# Patient Record
Sex: Female | Born: 1935 | Race: Black or African American | Hispanic: No | Smoking: Never smoker
Health system: Southern US, Community
[De-identification: ages and names within clinical notes are randomized; demographics above are authoritative.]

## PROBLEM LIST (undated history)

## (undated) DIAGNOSIS — I10 Essential (primary) hypertension: Secondary | ICD-10-CM

## (undated) DIAGNOSIS — E119 Type 2 diabetes mellitus without complications: Secondary | ICD-10-CM

## (undated) DIAGNOSIS — D649 Anemia, unspecified: Secondary | ICD-10-CM

## (undated) DIAGNOSIS — I639 Cerebral infarction, unspecified: Secondary | ICD-10-CM

---

## 2013-02-11 DIAGNOSIS — I639 Cerebral infarction, unspecified: Secondary | ICD-10-CM

## 2013-02-11 HISTORY — DX: Cerebral infarction, unspecified: I63.9

## 2013-09-25 ENCOUNTER — Emergency Department (HOSPITAL_COMMUNITY): Payer: Medicare Other

## 2013-09-25 ENCOUNTER — Encounter (HOSPITAL_COMMUNITY): Payer: Medicare Other | Admitting: Certified Registered Nurse Anesthetist

## 2013-09-25 ENCOUNTER — Inpatient Hospital Stay (HOSPITAL_COMMUNITY): Payer: Medicare Other | Admitting: Certified Registered Nurse Anesthetist

## 2013-09-25 ENCOUNTER — Encounter (HOSPITAL_COMMUNITY): Payer: Self-pay | Admitting: Emergency Medicine

## 2013-09-25 ENCOUNTER — Encounter (HOSPITAL_COMMUNITY)
Admission: EM | Disposition: A | Discharge status: Skilled Nursing Facility | Payer: Self-pay | Source: Home / Self Care | Attending: Neurology

## 2013-09-25 ENCOUNTER — Inpatient Hospital Stay (HOSPITAL_COMMUNITY): Payer: Medicare Other

## 2013-09-25 ENCOUNTER — Inpatient Hospital Stay (HOSPITAL_COMMUNITY)
Admission: EM | Admit: 2013-09-25 | Discharge: 2013-10-11 | DRG: 064 | Disposition: A | Discharge status: Skilled Nursing Facility | Payer: Medicare Other | Attending: Neurology | Admitting: Neurology

## 2013-09-25 DIAGNOSIS — G9340 Encephalopathy, unspecified: Secondary | ICD-10-CM | POA: Diagnosis present

## 2013-09-25 DIAGNOSIS — I2510 Atherosclerotic heart disease of native coronary artery without angina pectoris: Secondary | ICD-10-CM | POA: Diagnosis present

## 2013-09-25 DIAGNOSIS — K921 Melena: Secondary | ICD-10-CM | POA: Diagnosis not present

## 2013-09-25 DIAGNOSIS — N179 Acute kidney failure, unspecified: Secondary | ICD-10-CM | POA: Diagnosis present

## 2013-09-25 DIAGNOSIS — I6340 Cerebral infarction due to embolism of unspecified cerebral artery: Secondary | ICD-10-CM | POA: Diagnosis present

## 2013-09-25 DIAGNOSIS — R4701 Aphasia: Secondary | ICD-10-CM | POA: Diagnosis present

## 2013-09-25 DIAGNOSIS — G8190 Hemiplegia, unspecified affecting unspecified side: Secondary | ICD-10-CM | POA: Diagnosis present

## 2013-09-25 DIAGNOSIS — Z66 Do not resuscitate: Secondary | ICD-10-CM | POA: Diagnosis not present

## 2013-09-25 DIAGNOSIS — S301XXA Contusion of abdominal wall, initial encounter: Secondary | ICD-10-CM | POA: Diagnosis present

## 2013-09-25 DIAGNOSIS — L97909 Non-pressure chronic ulcer of unspecified part of unspecified lower leg with unspecified severity: Secondary | ICD-10-CM | POA: Diagnosis present

## 2013-09-25 DIAGNOSIS — I6350 Cerebral infarction due to unspecified occlusion or stenosis of unspecified cerebral artery: Secondary | ICD-10-CM | POA: Diagnosis present

## 2013-09-25 DIAGNOSIS — I63412 Cerebral infarction due to embolism of left middle cerebral artery: Secondary | ICD-10-CM

## 2013-09-25 DIAGNOSIS — R51 Headache: Secondary | ICD-10-CM | POA: Diagnosis not present

## 2013-09-25 DIAGNOSIS — D62 Acute posthemorrhagic anemia: Secondary | ICD-10-CM | POA: Diagnosis present

## 2013-09-25 DIAGNOSIS — D65 Disseminated intravascular coagulation [defibrination syndrome]: Secondary | ICD-10-CM | POA: Diagnosis present

## 2013-09-25 DIAGNOSIS — E039 Hypothyroidism, unspecified: Secondary | ICD-10-CM | POA: Diagnosis present

## 2013-09-25 DIAGNOSIS — F0390 Unspecified dementia without behavioral disturbance: Secondary | ICD-10-CM | POA: Diagnosis present

## 2013-09-25 DIAGNOSIS — I63032 Cerebral infarction due to thrombosis of left carotid artery: Secondary | ICD-10-CM

## 2013-09-25 DIAGNOSIS — L97921 Non-pressure chronic ulcer of unspecified part of left lower leg limited to breakdown of skin: Secondary | ICD-10-CM

## 2013-09-25 DIAGNOSIS — E872 Acidosis, unspecified: Secondary | ICD-10-CM | POA: Diagnosis present

## 2013-09-25 DIAGNOSIS — H409 Unspecified glaucoma: Secondary | ICD-10-CM | POA: Diagnosis present

## 2013-09-25 DIAGNOSIS — R578 Other shock: Secondary | ICD-10-CM | POA: Diagnosis present

## 2013-09-25 DIAGNOSIS — S88119A Complete traumatic amputation at level between knee and ankle, unspecified lower leg, initial encounter: Secondary | ICD-10-CM | POA: Diagnosis not present

## 2013-09-25 DIAGNOSIS — Z515 Encounter for palliative care: Secondary | ICD-10-CM

## 2013-09-25 DIAGNOSIS — R5381 Other malaise: Secondary | ICD-10-CM | POA: Diagnosis present

## 2013-09-25 DIAGNOSIS — I498 Other specified cardiac arrhythmias: Secondary | ICD-10-CM | POA: Diagnosis present

## 2013-09-25 DIAGNOSIS — R4182 Altered mental status, unspecified: Secondary | ICD-10-CM | POA: Diagnosis present

## 2013-09-25 DIAGNOSIS — I469 Cardiac arrest, cause unspecified: Secondary | ICD-10-CM | POA: Diagnosis present

## 2013-09-25 DIAGNOSIS — Y9210 Unspecified residential institution as the place of occurrence of the external cause: Secondary | ICD-10-CM | POA: Diagnosis not present

## 2013-09-25 DIAGNOSIS — Y849 Medical procedure, unspecified as the cause of abnormal reaction of the patient, or of later complication, without mention of misadventure at the time of the procedure: Secondary | ICD-10-CM | POA: Diagnosis not present

## 2013-09-25 DIAGNOSIS — I10 Essential (primary) hypertension: Secondary | ICD-10-CM | POA: Diagnosis present

## 2013-09-25 DIAGNOSIS — R1310 Dysphagia, unspecified: Secondary | ICD-10-CM | POA: Diagnosis present

## 2013-09-25 DIAGNOSIS — I739 Peripheral vascular disease, unspecified: Secondary | ICD-10-CM | POA: Diagnosis present

## 2013-09-25 DIAGNOSIS — E119 Type 2 diabetes mellitus without complications: Secondary | ICD-10-CM | POA: Diagnosis present

## 2013-09-25 DIAGNOSIS — J9600 Acute respiratory failure, unspecified whether with hypoxia or hypercapnia: Secondary | ICD-10-CM | POA: Diagnosis present

## 2013-09-25 DIAGNOSIS — I160 Hypertensive urgency: Secondary | ICD-10-CM

## 2013-09-25 DIAGNOSIS — I69391 Dysphagia following cerebral infarction: Secondary | ICD-10-CM

## 2013-09-25 DIAGNOSIS — E876 Hypokalemia: Secondary | ICD-10-CM | POA: Diagnosis present

## 2013-09-25 DIAGNOSIS — I440 Atrioventricular block, first degree: Secondary | ICD-10-CM | POA: Diagnosis present

## 2013-09-25 DIAGNOSIS — I639 Cerebral infarction, unspecified: Secondary | ICD-10-CM

## 2013-09-25 DIAGNOSIS — R58 Hemorrhage, not elsewhere classified: Secondary | ICD-10-CM | POA: Diagnosis present

## 2013-09-25 DIAGNOSIS — E785 Hyperlipidemia, unspecified: Secondary | ICD-10-CM | POA: Diagnosis present

## 2013-09-25 DIAGNOSIS — Z7982 Long term (current) use of aspirin: Secondary | ICD-10-CM

## 2013-09-25 HISTORY — PX: RADIOLOGY WITH ANESTHESIA: SHX6223

## 2013-09-25 HISTORY — DX: Cerebral infarction, unspecified: I63.9

## 2013-09-25 HISTORY — DX: Essential (primary) hypertension: I10

## 2013-09-25 HISTORY — DX: Type 2 diabetes mellitus without complications: E11.9

## 2013-09-25 HISTORY — DX: Anemia, unspecified: D64.9

## 2013-09-25 LAB — CBC
HEMATOCRIT: 31.1 % — AB (ref 36.0–46.0)
HEMOGLOBIN: 9.9 g/dL — AB (ref 12.0–15.0)
MCH: 31.7 pg (ref 26.0–34.0)
MCHC: 31.8 g/dL (ref 30.0–36.0)
MCV: 99.7 fL (ref 78.0–100.0)
Platelets: 332 10*3/uL (ref 150–400)
RBC: 3.12 MIL/uL — ABNORMAL LOW (ref 3.87–5.11)
RDW: 13.7 % (ref 11.5–15.5)
WBC: 6.9 10*3/uL (ref 4.0–10.5)

## 2013-09-25 LAB — DIFFERENTIAL
Basophils Absolute: 0 10*3/uL (ref 0.0–0.1)
Basophils Relative: 1 % (ref 0–1)
Eosinophils Absolute: 0.1 10*3/uL (ref 0.0–0.7)
Eosinophils Relative: 1 % (ref 0–5)
Lymphocytes Relative: 25 % (ref 12–46)
Lymphs Abs: 1.7 10*3/uL (ref 0.7–4.0)
MONOS PCT: 5 % (ref 3–12)
Monocytes Absolute: 0.4 10*3/uL (ref 0.1–1.0)
NEUTROS ABS: 4.7 10*3/uL (ref 1.7–7.7)
Neutrophils Relative %: 68 % (ref 43–77)

## 2013-09-25 LAB — I-STAT CHEM 8, ED
BUN: 23 mg/dL (ref 6–23)
CALCIUM ION: 1.09 mmol/L — AB (ref 1.13–1.30)
CREATININE: 1.5 mg/dL — AB (ref 0.50–1.10)
Chloride: 106 mEq/L (ref 96–112)
Glucose, Bld: 157 mg/dL — ABNORMAL HIGH (ref 70–99)
HCT: 33 % — ABNORMAL LOW (ref 36.0–46.0)
Hemoglobin: 11.2 g/dL — ABNORMAL LOW (ref 12.0–15.0)
POTASSIUM: 3.3 meq/L — AB (ref 3.7–5.3)
Sodium: 137 mEq/L (ref 137–147)
TCO2: 21 mmol/L (ref 0–100)

## 2013-09-25 LAB — I-STAT TROPONIN, ED: TROPONIN I, POC: 0.27 ng/mL — AB (ref 0.00–0.08)

## 2013-09-25 LAB — CBG MONITORING, ED: Glucose-Capillary: 145 mg/dL — ABNORMAL HIGH (ref 70–99)

## 2013-09-25 LAB — PROTIME-INR
INR: 1.04 (ref 0.00–1.49)
Prothrombin Time: 13.6 seconds (ref 11.6–15.2)

## 2013-09-25 LAB — APTT: APTT: 24 s (ref 24–37)

## 2013-09-25 SURGERY — RADIOLOGY WITH ANESTHESIA
Anesthesia: General

## 2013-09-25 MED ORDER — ROCURONIUM BROMIDE 50 MG/5ML IV SOLN
INTRAVENOUS | Status: AC
  Filled 2013-09-25: qty 1

## 2013-09-25 MED ORDER — SUCCINYLCHOLINE CHLORIDE 20 MG/ML IJ SOLN
INTRAMUSCULAR | Status: DC | PRN
Start: 2013-09-25 — End: 2013-09-26
  Administered 2013-09-25: 60 mg via INTRAVENOUS

## 2013-09-25 MED ORDER — PROPOFOL 10 MG/ML IV BOLUS
INTRAVENOUS | Status: DC | PRN
  Administered 2013-09-25: 70 mg via INTRAVENOUS
  Administered 2013-09-26 (×2): 50 mg via INTRAVENOUS

## 2013-09-25 MED ORDER — HYDRALAZINE HCL 20 MG/ML IJ SOLN
10.0000 mg | Freq: Once | INTRAMUSCULAR | Status: AC
  Administered 2013-09-25: 10 mg via INTRAVENOUS

## 2013-09-25 MED ORDER — LIDOCAINE HCL 1 % IJ SOLN
INTRAMUSCULAR | Status: AC
Start: 2013-09-25 — End: 2013-09-26
  Filled 2013-09-25: qty 20

## 2013-09-25 MED ORDER — ARTIFICIAL TEARS OP OINT
TOPICAL_OINTMENT | OPHTHALMIC | Status: AC
  Filled 2013-09-25: qty 3.5

## 2013-09-25 MED ORDER — FENTANYL CITRATE 0.05 MG/ML IJ SOLN
INTRAMUSCULAR | Status: AC
  Filled 2013-09-25: qty 5

## 2013-09-25 MED ORDER — PROPOFOL 10 MG/ML IV BOLUS
INTRAVENOUS | Status: AC
  Filled 2013-09-25: qty 20

## 2013-09-25 MED ORDER — ONDANSETRON HCL 4 MG/2ML IJ SOLN
INTRAMUSCULAR | Status: AC
Start: 2013-09-25 — End: 2013-09-25
  Filled 2013-09-25: qty 2

## 2013-09-25 MED ORDER — EPHEDRINE SULFATE 50 MG/ML IJ SOLN
INTRAMUSCULAR | Status: AC
  Filled 2013-09-25: qty 1

## 2013-09-25 MED ORDER — LIDOCAINE HCL (CARDIAC) 20 MG/ML IV SOLN
INTRAVENOUS | Status: DC | PRN
  Administered 2013-09-25: 60 mg via INTRAVENOUS

## 2013-09-25 MED ORDER — IOHEXOL 300 MG/ML  SOLN
150.0000 mL | Freq: Once | INTRAMUSCULAR | Status: AC | PRN
  Administered 2013-09-25: 150 mL via INTRA_ARTERIAL

## 2013-09-25 MED ORDER — HYDRALAZINE HCL 20 MG/ML IJ SOLN
INTRAMUSCULAR | Status: AC
  Administered 2013-09-25: 10 mg via INTRAVENOUS
  Filled 2013-09-25: qty 1

## 2013-09-25 MED ORDER — SODIUM CHLORIDE 0.9 % IJ SOLN
INTRAMUSCULAR | Status: AC
  Filled 2013-09-25: qty 10

## 2013-09-25 NOTE — ED Notes (Signed)
Dr. Rubin PayorPickering notified of elevated i-stat troponin

## 2013-09-25 NOTE — ED Provider Notes (Signed)
CSN: [id number]     Arrival date & time 09/25/13  2219 History   First MD Initiated Contact with Patient 09/25/13 2223     Chief Complaint  Patient presents with  . Code Stroke   Level V caveat due to altered mental status An emergency department physician performed an initial assessment on this suspected stroke patient at 2220. (Consider location/radiation/quality/duration/timing/severity/associated sxs/prior Treatment) The history is provided by the patient and the EMS personnel.   patient came in as a code stroke. She lives in a nursing home and was last seen normal at 5 PM, around 5 hours prior to arrival. She was checked on later and found to be much less responsive with inability to move her right side and a little to the left. She's been nonverbal.  History reviewed. No pertinent past medical history. History reviewed. No pertinent past surgical history. No family history on file. History  Substance Use Topics  . Smoking status: Unknown If Ever Smoked  . Smokeless tobacco: Not on file  . Alcohol Use: Not on file   OB History   Grav Para Term Preterm Abortions TAB SAB Ect Mult Living                 Review of Systems  Unable to perform ROS     Allergies  Review of patient's allergies indicates no known allergies.  Home Medications   Prior to Admission medications   Medication Sig Start Date End Date Taking? Authorizing Provider  amLODipine (NORVASC) 10 MG tablet Take 10 mg by mouth daily.   Yes Historical Provider, MD  aspirin EC 81 MG tablet Take 81 mg by mouth daily.   Yes Historical Provider, MD  brimonidine-timolol (COMBIGAN) 0.2-0.5 % ophthalmic solution Place 1 drop into both eyes every 12 (twelve) hours.   Yes Historical Provider, MD  brinzolamide (AZOPT) 1 % ophthalmic suspension Place 1 drop into both eyes 2 (two) times daily.   Yes Historical Provider, MD  carvedilol (COREG) 3.125 MG tablet Take 3.125 mg by mouth 2 (two) times daily with a meal.   Yes  Historical Provider, MD  dorzolamide-timolol (COSOPT) 22.3-6.8 MG/ML ophthalmic solution Place 1 drop into both eyes 2 (two) times daily.   Yes Historical Provider, MD  estradiol (ESTRACE) 0.1 MG/GM vaginal cream Place 1 Applicatorful vaginally 2 (two) times a week.   Yes Historical Provider, MD  hydrALAZINE (APRESOLINE) 100 MG tablet Take 100 mg by mouth 3 (three) times daily.   Yes Historical Provider, MD  HYDROcodone-acetaminophen (NORCO/VICODIN) 5-325 MG per tablet Take 1 tablet by mouth every 4 (four) hours as needed for moderate pain.   Yes Historical Provider, MD  iron polysaccharides (NIFEREX) 150 MG capsule Take 150 mg by mouth daily.   Yes Historical Provider, MD  isosorbide mononitrate (IMDUR) 60 MG 24 hr tablet Take 60 mg by mouth daily.   Yes Historical Provider, MD  lisinopril (PRINIVIL,ZESTRIL) 20 MG tablet Take 20 mg by mouth daily.   Yes Historical Provider, MD  memantine (NAMENDA) 10 MG tablet Take 10 mg by mouth 2 (two) times daily.   Yes Historical Provider, MD  methazolamide (NEPTAZANE) 25 MG tablet Take 25 mg by mouth 2 (two) times daily.   Yes Historical Provider, MD  methimazole (TAPAZOLE) 5 MG tablet Take 5 mg by mouth daily. Take ONCE daily on Mon, Wed, & Fri. Take TWICE daily on Tues, Thurs, Sat, & Sun   Yes Historical Provider, MD  omeprazole (PRILOSEC) 20 MG capsule Take 20 mg  by mouth daily.   Yes Historical Provider, MD  pilocarpine (PILOCAR) 2 % ophthalmic solution Place 1 drop into both eyes 4 (four) times daily.   Yes Historical Provider, MD  polyethylene glycol (MIRALAX / GLYCOLAX) packet Take 17 g by mouth 2 (two) times daily.   Yes Historical Provider, MD  pravastatin (PRAVACHOL) 10 MG tablet Take 10 mg by mouth at bedtime.   Yes Historical Provider, MD  sertraline (ZOLOFT) 100 MG tablet Take 100 mg by mouth daily. Take with 25mg  to equal total 125mg    Yes Historical Provider, MD  sertraline (ZOLOFT) 25 MG tablet Take 25 mg by mouth daily. Take with 100mg  to equal  total 125mg    Yes Historical Provider, MD  Skin Protectants, Misc. (EUCERIN) cream Apply 1 application topically as needed for dry skin.   Yes Historical Provider, MD  Travoprost, BAK Free, (TRAVATAN) 0.004 % SOLN ophthalmic solution Place 1 drop into the right eye at bedtime.   Yes Historical Provider, MD   BP 201/74  Pulse 68  Resp 16  Ht 5\' 3"  (1.6 m)  Wt 120 lb (54.432 kg)  BMI 21.26 kg/m2  SpO2 100% Physical Exam  Constitutional: She appears well-developed.  HENT:  Head: Atraumatic.  Eyes:  Patient with gaze to the left, we'll not cross the midline. Will look to voice somewhat.  Neck: Normal range of motion. Neck supple.  Cardiovascular: Normal rate.   Pulmonary/Chest: Effort normal.  Transmitted upper airway sounds  Abdominal: Soft. There is no tenderness.  Neurological:  Patient will follow commands on the left side side. Will squeeze hand to command and release to command. She is nonverbal. Eye movements primarily the left will not cross midline. No commands on right side. Some slight movement on right. Complete NIH score done by neurology.    ED Course  Procedures (including critical care time) Labs Review Labs Reviewed  CBC - Abnormal; Notable for the following:    RBC 3.12 (*)    Hemoglobin 9.9 (*)    HCT 31.1 (*)    All other components within normal limits  COMPREHENSIVE METABOLIC PANEL - Abnormal; Notable for the following:    Glucose, Bld 164 (*)    BUN 25 (*)    Creatinine, Ser 1.32 (*)    Albumin 3.0 (*)    ALT 1174 (*)    Total Bilirubin 0.2 (*)    GFR calc non Af Amer 35 (*)    GFR calc Af Amer 41 (*)    Anion gap 16 (*)    All other components within normal limits  CBG MONITORING, ED - Abnormal; Notable for the following:    Glucose-Capillary 145 (*)    All other components within normal limits  I-STAT CHEM 8, ED - Abnormal; Notable for the following:    Potassium 3.3 (*)    Creatinine, Ser 1.50 (*)    Glucose, Bld 157 (*)    Calcium, Ion 1.09  (*)    Hemoglobin 11.2 (*)    HCT 33.0 (*)    All other components within normal limits  I-STAT TROPOININ, ED - Abnormal; Notable for the following:    Troponin i, poc 0.27 (*)    All other components within normal limits  PROTIME-INR  APTT  DIFFERENTIAL    Imaging Review Ct Head (brain) Wo Contrast  09/25/2013   CLINICAL DATA:  Code stroke. Right-sided facial droop, left-sided gaze and slurred speech.  EXAM: CT HEAD WITHOUT CONTRAST  TECHNIQUE: Contiguous axial images were obtained  from the base of the skull through the vertex without intravenous contrast.  COMPARISON:  None.  FINDINGS: There is no evidence of acute infarction, mass lesion, or intra- or extra-axial hemorrhage on CT.  Prominence of the ventricles and sulci reflects mild to moderate cortical volume loss. Diffuse periventricular and subcortical white matter change likely reflects small vessel ischemic microangiopathy.  The brainstem and fourth ventricle are within normal limits. The basal ganglia are unremarkable in appearance. The cerebral hemispheres demonstrate grossly normal gray-white differentiation. No mass effect or midline shift is seen.  There is no evidence of fracture; visualized osseous structures are unremarkable in appearance. The orbits are within normal limits. The paranasal sinuses and mastoid air cells are well-aerated. No significant soft tissue abnormalities are seen.  IMPRESSION: 1. No acute intracranial pathology seen on CT. 2. Mild to moderate cortical volume loss and diffuse small vessel ischemic microangiopathy.  These results were called by telephone at the time of interpretation on 09/25/2013 at 10:36 pm to Dr. Roseanne RenoStewart, who verbally acknowledged these results.   Electronically Signed   By: Roanna RaiderJeffery  Chang M.D.   On: 09/25/2013 22:38     EKG Interpretation None      MDM   Final diagnoses:  Stroke    Patient with acute stroke. Not a TPA candidate due to 2 onset time. Discussed with family. Will be  taken for interventional radiology.  CRITICAL CARE Performed by: Billee CashingPICKERING,Jaima Janney R. Total critical care time: 30 Critical care time was exclusive of separately billable procedures and treating other patients. Critical care was necessary to treat or prevent imminent or life-threatening deterioration. Critical care was time spent personally by me on the following activities: development of treatment plan with patient and/or surrogate as well as nursing, discussions with consultants, evaluation of patient's response to treatment, examination of patient, obtaining history from patient or surrogate, ordering and performing treatments and interventions, ordering and review of laboratory studies, ordering and review of radiographic studies, pulse oximetry and re-evaluation of patient's condition.    Juliet RudeNathan R. Rubin PayorPickering, MD 09/26/13 [id number]

## 2013-09-25 NOTE — ED Notes (Signed)
Pt from [facility] on [address] 1700 today, last seen by son. 2100 more difficult to arouse, had already been put to bed. Alert to verbal, mumbling, not able to speak, R sided facial droop, R arm difficulty moving. Last  Set VS 128/58, 56 HR, 20 RR. 18 L AC IV placed.

## 2013-09-25 NOTE — H&P (Signed)
Admission H&P    Chief Complaint: Acute onset of aphasia and right hemiparesis.  HPI: Victoria West is an 80 y.o. female with a history of hypertension, hypothyroidism, glaucoma and dementia who was brought to the emergency room after being found unable to speak as well as unable to move her right side and gaze to the left side. There is no previous history of stroke. CT scan of her head showed no acute intracranial abnormality. She was last seen well at 1700 today. She was beyond time window for intervention with TPA when she arrived in the emergency room. NIH stroke score was 24.  LSN: 1700 on 09/25/2013 tPA Given: No: Beyond time under for treatment consideration MRankin: 4  History reviewed. No pertinent past medical history.  History reviewed. No pertinent past surgical history.  No family history on file. Social History:  has no tobacco, alcohol, and drug history on file.  Allergies: No Known Allergies   (Not in a hospital admission)  ROS: Unavailable, do to patient's aphasia.  Physical Examination: Blood pressure 180/90, pulse 53, resp. rate 16, height 5\' 3"  (1.6 m), weight 54.432 kg (120 lb), SpO2 100.00%.  HEENT-  Normocephalic, no lesions, without obvious abnormality.  Normal external eye and conjunctiva.  Normal TM's bilaterally.  Normal auditory canals and external ears. Normal external nose, mucus membranes and septum.  Normal pharynx. Neck supple with no masses, nodes, nodules or enlargement. Cardiovascular - regularly irregular rhythm and S1, S2 normal Lungs - chest clear, no wheezing, rales, normal symmetric air entry, Heart exam - S1, S2 normal, no murmur, no gallop, rate regular Abdomen - soft, non-tender; bowel sounds normal; no masses,  no organomegaly Extremities - right BK amputation with stump bandage; bandage left leg and foot with history of venous stasis ulcer. Skin discoloration about bandage noted.  Neurologic Examination: Mental Status: Alert, globally  aphasic. Able to follow a few commands with use of right extremities. Cranial Nerves: II-dense right homonymous hemianopsia. III/IV/VI-Pupils were equal and reacted. Tonic gaze to the left side conjugately.    VII-moderately severe right lower facial weakness. VIII-normal. X-no speech output. Motor: Severe flaccid in a parous is involving right upper and lower extremities with minimal movement noted. Normal strength and muscle tone of left extremities Sensory: Ignores right side. Deep Tendon Reflexes: Absent throughout. Plantars: Mute on the left Cerebellar: Unable to assess  Results for orders placed during the hospital encounter of 09/25/13 (from the past 48 hour(s))  CBG MONITORING, ED     Status: Abnormal   Collection Time    09/25/13 10:23 PM      Result Value Ref Range   Glucose-Capillary 145 (*) 70 - 99 mg/dL  PROTIME-INR     Status: None   Collection Time    09/25/13 10:25 PM      Result Value Ref Range   Prothrombin Time 13.6  11.6 - 15.2 seconds   INR 1.04  0.00 - 1.49  APTT     Status: None   Collection Time    09/25/13 10:25 PM      Result Value Ref Range   aPTT 24  24 - 37 seconds  CBC     Status: Abnormal   Collection Time    09/25/13 10:25 PM      Result Value Ref Range   WBC 6.9  4.0 - 10.5 K/uL   RBC 3.12 (*) 3.87 - 5.11 MIL/uL   Hemoglobin 9.9 (*) 12.0 - 15.0 g/dL   HCT 16.1 (*) 09.6 -  46.0 %   MCV 99.7  78.0 - 100.0 fL   MCH 31.7  26.0 - 34.0 pg   MCHC 31.8  30.0 - 36.0 g/dL   RDW 81.1  91.4 - 78.2 %   Platelets 332  150 - 400 K/uL  DIFFERENTIAL     Status: None   Collection Time    09/25/13 10:25 PM      Result Value Ref Range   Neutrophils Relative % 68  43 - 77 %   Neutro Abs 4.7  1.7 - 7.7 K/uL   Lymphocytes Relative 25  12 - 46 %   Lymphs Abs 1.7  0.7 - 4.0 K/uL   Monocytes Relative 5  3 - 12 %   Monocytes Absolute 0.4  0.1 - 1.0 K/uL   Eosinophils Relative 1  0 - 5 %   Eosinophils Absolute 0.1  0.0 - 0.7 K/uL   Basophils Relative 1  0 -  1 %   Basophils Absolute 0.0  0.0 - 0.1 K/uL  I-STAT CHEM 8, ED     Status: Abnormal   Collection Time    09/25/13 10:32 PM      Result Value Ref Range   Sodium 137  137 - 147 mEq/L   Potassium 3.3 (*) 3.7 - 5.3 mEq/L   Chloride 106  96 - 112 mEq/L   BUN 23  6 - 23 mg/dL   Creatinine, Ser 9.56 (*) 0.50 - 1.10 mg/dL   Glucose, Bld 213 (*) 70 - 99 mg/dL   Calcium, Ion 0.86 (*) 1.13 - 1.30 mmol/L   TCO2 21  0 - 100 mmol/L   Hemoglobin 11.2 (*) 12.0 - 15.0 g/dL   HCT 57.8 (*) 46.9 - 62.9 %  I-STAT TROPOININ, ED     Status: Abnormal   Collection Time    09/25/13 10:42 PM      Result Value Ref Range   Troponin i, poc 0.27 (*) 0.00 - 0.08 ng/mL   Comment NOTIFIED PHYSICIAN     Comment 3            Comment: Due to the release kinetics of cTnI,     a negative result within the first hours     of the onset of symptoms does not rule out     myocardial infarction with certainty.     If myocardial infarction is still suspected,     repeat the test at appropriate intervals.   Ct Head (brain) Wo Contrast  09/25/2013   CLINICAL DATA:  Code stroke. Right-sided facial droop, left-sided gaze and slurred speech.  EXAM: CT HEAD WITHOUT CONTRAST  TECHNIQUE: Contiguous axial images were obtained from the base of the skull through the vertex without intravenous contrast.  COMPARISON:  None.  FINDINGS: There is no evidence of acute infarction, mass lesion, or intra- or extra-axial hemorrhage on CT.  Prominence of the ventricles and sulci reflects mild to moderate cortical volume loss. Diffuse periventricular and subcortical white matter change likely reflects small vessel ischemic microangiopathy.  The brainstem and fourth ventricle are within normal limits. The basal ganglia are unremarkable in appearance. The cerebral hemispheres demonstrate grossly normal gray-white differentiation. No mass effect or midline shift is seen.  There is no evidence of fracture; visualized osseous structures are unremarkable in  appearance. The orbits are within normal limits. The paranasal sinuses and mastoid air cells are well-aerated. No significant soft tissue abnormalities are seen.  IMPRESSION: 1. No acute intracranial pathology seen on CT. 2.  Mild to moderate cortical volume loss and diffuse small vessel ischemic microangiopathy.  These results were called by telephone at the time of interpretation on 09/25/2013 at 10:36 pm to Dr. Roseanne RenoStewart, who verbally acknowledged these results.   Electronically Signed   By: Roanna RaiderJeffery  Chang M.D.   On: 09/25/2013 22:38    Assessment: 80 y.o. female with a history of hypertension presenting with acute left MCA territory large ischemic infarction. Patient was beyond time window for treatment consideration with TPA at the time she arrived in the emergency room. Further evaluation with cerebral arteriogram and possible interventional procedure for revascularization is planned.  Stroke Risk Factors - hypertension  Plan: 1. HgbA1c, fasting lipid panel 2. MRI  of the brain without contrast 3. PT consult, OT consult, Speech consult 4. Echocardiogram 5. Carotid dopplers 6. Prophylactic therapy-Antiplatelet med: Aspirin if CT scan of the head 24 hours post interventional radiology procedures shows no signs of intracranial hemorrhage. 7. Risk factor modification 8. Telemetry monitoring  Patient admission evaluation and management required complex clinical decision-making as well as consultation with neuroradiologist and counseling with patient's family, in addition to admission to the neuro critical care unit. Total critical care time was 90 minutes.  C.R. Roseanne RenoStewart, MD [facility] [phone]  09/25/2013, 10:57 PM

## 2013-09-25 NOTE — Code Documentation (Signed)
80 [facility] b[facility] with Lt gave deviation, facial droop, & Rt side weakness.  Per pt's son LKW 1700 & staff found her with new stroke s/s at 2100. NIH 22, Rt side weakness, aphasia, dysarthria, neglect, & visual loss.  Pt is outside the tPA window.  Plan for IR.  Pt's son is at the bedside.

## 2013-09-26 ENCOUNTER — Inpatient Hospital Stay (HOSPITAL_COMMUNITY): Payer: Medicare Other

## 2013-09-26 DIAGNOSIS — I6350 Cerebral infarction due to unspecified occlusion or stenosis of unspecified cerebral artery: Secondary | ICD-10-CM

## 2013-09-26 DIAGNOSIS — I63239 Cerebral infarction due to unspecified occlusion or stenosis of unspecified carotid arteries: Secondary | ICD-10-CM

## 2013-09-26 DIAGNOSIS — I6340 Cerebral infarction due to embolism of unspecified cerebral artery: Principal | ICD-10-CM | POA: Diagnosis present

## 2013-09-26 DIAGNOSIS — I160 Hypertensive urgency: Secondary | ICD-10-CM | POA: Diagnosis present

## 2013-09-26 LAB — BASIC METABOLIC PANEL
Anion gap: 13 (ref 5–15)
BUN: 24 mg/dL — ABNORMAL HIGH (ref 6–23)
CHLORIDE: 109 meq/L (ref 96–112)
CO2: 17 meq/L — AB (ref 19–32)
CREATININE: 1.17 mg/dL — AB (ref 0.50–1.10)
Calcium: 7.2 mg/dL — ABNORMAL LOW (ref 8.4–10.5)
GFR calc non Af Amer: 41 mL/min — ABNORMAL LOW (ref >90)
GFR, EST AFRICAN AMERICAN: 47 mL/min — AB (ref >90)
Glucose, Bld: 262 mg/dL — ABNORMAL HIGH (ref 70–99)
Potassium: 2.7 mEq/L — CL (ref 3.7–5.3)
Sodium: 139 mEq/L (ref 137–147)

## 2013-09-26 LAB — TROPONIN I
Troponin I: 0.45 ng/mL (ref <0.30)
Troponin I: 0.5 ng/mL (ref <0.30)

## 2013-09-26 LAB — CBC WITH DIFFERENTIAL/PLATELET
Basophils Absolute: 0 10*3/uL (ref 0.0–0.1)
Basophils Relative: 1 % (ref 0–1)
EOS PCT: 2 % (ref 0–5)
Eosinophils Absolute: 0.1 10*3/uL (ref 0.0–0.7)
HEMATOCRIT: 24 % — AB (ref 36.0–46.0)
HEMOGLOBIN: 7.7 g/dL — AB (ref 12.0–15.0)
Lymphocytes Relative: 29 % (ref 12–46)
Lymphs Abs: 1 10*3/uL (ref 0.7–4.0)
MCH: 32.2 pg (ref 26.0–34.0)
MCHC: 32.1 g/dL (ref 30.0–36.0)
MCV: 100.4 fL — AB (ref 78.0–100.0)
MONO ABS: 0.1 10*3/uL (ref 0.1–1.0)
MONOS PCT: 3 % (ref 3–12)
NEUTROS ABS: 2.2 10*3/uL (ref 1.7–7.7)
Neutrophils Relative %: 66 % (ref 43–77)
Platelets: 241 10*3/uL (ref 150–400)
RBC: 2.39 MIL/uL — ABNORMAL LOW (ref 3.87–5.11)
RDW: 13.6 % (ref 11.5–15.5)
WBC: 3.3 10*3/uL — ABNORMAL LOW (ref 4.0–10.5)

## 2013-09-26 LAB — COMPREHENSIVE METABOLIC PANEL
ALT: <5 U/L (ref 0–35)
ANION GAP: 16 — AB (ref 5–15)
AST: 13 U/L (ref 0–37)
Albumin: 3 g/dL — ABNORMAL LOW (ref 3.5–5.2)
Alkaline Phosphatase: 90 U/L (ref 39–117)
BUN: 25 mg/dL — ABNORMAL HIGH (ref 6–23)
CHLORIDE: 101 meq/L (ref 96–112)
CO2: 21 meq/L (ref 19–32)
CREATININE: 1.32 mg/dL — AB (ref 0.50–1.10)
Calcium: 8.7 mg/dL (ref 8.4–10.5)
GFR calc Af Amer: 41 mL/min — ABNORMAL LOW (ref >90)
GFR calc non Af Amer: 35 mL/min — ABNORMAL LOW (ref >90)
Glucose, Bld: 164 mg/dL — ABNORMAL HIGH (ref 70–99)
Potassium: 3.7 mEq/L (ref 3.7–5.3)
Sodium: 138 mEq/L (ref 137–147)
Total Bilirubin: 0.2 mg/dL — ABNORMAL LOW (ref 0.3–1.2)
Total Protein: 6.3 g/dL (ref 6.0–8.3)

## 2013-09-26 LAB — CK TOTAL AND CKMB (NOT AT ARMC)
CK, MB: 4.9 ng/mL — AB (ref 0.3–4.0)
Relative Index: INVALID (ref 0.0–2.5)
Total CK: 97 U/L (ref 7–177)

## 2013-09-26 LAB — CBC
HCT: 21.8 % — ABNORMAL LOW (ref 36.0–46.0)
HCT: 28.3 % — ABNORMAL LOW (ref 36.0–46.0)
HCT: 28.2 % — ABNORMAL LOW (ref 36.0–46.0)
HCT: 28 % — ABNORMAL LOW (ref 36.0–46.0)
HEMOGLOBIN: 9.5 g/dL — AB (ref 12.0–15.0)
HEMOGLOBIN: 9.7 g/dL — AB (ref 12.0–15.0)
Hemoglobin: 7 g/dL — ABNORMAL LOW (ref 12.0–15.0)
Hemoglobin: 9.9 g/dL — ABNORMAL LOW (ref 12.0–15.0)
MCH: 32.2 pg (ref 26.0–34.0)
MCH: 30.3 pg (ref 26.0–34.0)
MCH: 31 pg (ref 26.0–34.0)
MCH: 31.5 pg (ref 26.0–34.0)
MCHC: 32.4 g/dL (ref 30.0–36.0)
MCHC: 33.6 g/dL (ref 30.0–36.0)
MCHC: 34.4 g/dL (ref 30.0–36.0)
MCHC: 35.4 g/dL (ref 30.0–36.0)
MCV: 99.5 fL (ref 78.0–100.0)
MCV: 90.1 fL (ref 78.0–100.0)
MCV: 90.1 fL (ref 78.0–100.0)
MCV: 89.2 fL (ref 78.0–100.0)
PLATELETS: 240 10*3/uL (ref 150–400)
PLATELETS: 178 10*3/uL (ref 150–400)
Platelets: 191 10*3/uL (ref 150–400)
Platelets: 155 10*3/uL (ref 150–400)
RBC: 2.08 MIL/uL — ABNORMAL LOW (ref 3.87–5.11)
RBC: 3.14 MIL/uL — AB (ref 3.87–5.11)
RBC: 3.13 MIL/uL — ABNORMAL LOW (ref 3.87–5.11)
RBC: 3.14 MIL/uL — AB (ref 3.87–5.11)
RDW: 13.4 % (ref 11.5–15.5)
RDW: 15.5 % (ref 11.5–15.5)
RDW: 15.6 % — AB (ref 11.5–15.5)
RDW: 16.3 % — ABNORMAL HIGH (ref 11.5–15.5)
WBC: 6.9 10*3/uL (ref 4.0–10.5)
WBC: 9.9 10*3/uL (ref 4.0–10.5)
WBC: 14.9 10*3/uL — ABNORMAL HIGH (ref 4.0–10.5)
WBC: 16.7 10*3/uL — ABNORMAL HIGH (ref 4.0–10.5)

## 2013-09-26 LAB — ABO/RH: ABO/RH(D): B POS

## 2013-09-26 LAB — DIC (DISSEMINATED INTRAVASCULAR COAGULATION)PANEL
D-Dimer, Quant: 1.76 ug/mL-FEU — ABNORMAL HIGH (ref 0.00–0.48)
INR: 1.54 — ABNORMAL HIGH (ref 0.00–1.49)
Platelets: 191 10*3/uL (ref 150–400)
Smear Review: NONE SEEN
aPTT: 29 seconds (ref 24–37)

## 2013-09-26 LAB — LACTIC ACID, PLASMA: Lactic Acid, Venous: 0.9 mmol/L (ref 0.5–2.2)

## 2013-09-26 LAB — APTT: APTT: 152 s — AB (ref 24–37)

## 2013-09-26 LAB — GLUCOSE, CAPILLARY
GLUCOSE-CAPILLARY: 214 mg/dL — AB (ref 70–99)
GLUCOSE-CAPILLARY: 206 mg/dL — AB (ref 70–99)
GLUCOSE-CAPILLARY: 153 mg/dL — AB (ref 70–99)
GLUCOSE-CAPILLARY: 152 mg/dL — AB (ref 70–99)
Glucose-Capillary: 201 mg/dL — ABNORMAL HIGH (ref 70–99)
Glucose-Capillary: 180 mg/dL — ABNORMAL HIGH (ref 70–99)

## 2013-09-26 LAB — HEPATIC FUNCTION PANEL
ALK PHOS: 60 U/L (ref 39–117)
ALT: <5 U/L (ref 0–35)
AST: 7 U/L (ref 0–37)
Albumin: 2 g/dL — ABNORMAL LOW (ref 3.5–5.2)
Bilirubin, Direct: <0.2 mg/dL (ref 0.0–0.3)
Total Protein: 4.1 g/dL — ABNORMAL LOW (ref 6.0–8.3)

## 2013-09-26 LAB — BLOOD GAS, ARTERIAL
Acid-base deficit: 4.8 mmol/L — ABNORMAL HIGH (ref 0.0–2.0)
Bicarbonate: 19 mEq/L — ABNORMAL LOW (ref 20.0–24.0)
DRAWN BY: 418751
FIO2: 60 %
LHR: 14 {breaths}/min
O2 Content: 60 L/min
O2 Saturation: 99.1 %
PATIENT TEMPERATURE: 98.6
PEEP/CPAP: 5 cmH2O
PH ART: 7.412 (ref 7.350–7.450)
TCO2: 19.9 mmol/L (ref 0–100)
VT: 420 mL
pCO2 arterial: 30.5 mmHg — ABNORMAL LOW (ref 35.0–45.0)
pO2, Arterial: 296 mmHg — ABNORMAL HIGH (ref 80.0–100.0)

## 2013-09-26 LAB — PROTIME-INR
INR: 1.39 (ref 0.00–1.49)
Prothrombin Time: 17.1 seconds — ABNORMAL HIGH (ref 11.6–15.2)

## 2013-09-26 LAB — LIPID PANEL
CHOLESTEROL: 136 mg/dL (ref 0–200)
HDL: 26 mg/dL — ABNORMAL LOW (ref >39)
LDL CALC: 90 mg/dL (ref 0–99)
TRIGLYCERIDES: 102 mg/dL (ref <150)
Total CHOL/HDL Ratio: 5.2 RATIO
VLDL: 20 mg/dL (ref 0–40)

## 2013-09-26 LAB — T4, FREE: Free T4: 0.84 ng/dL (ref 0.80–1.80)

## 2013-09-26 LAB — TSH: TSH: 15.16 u[IU]/mL — AB (ref 0.350–4.500)

## 2013-09-26 LAB — DIC (DISSEMINATED INTRAVASCULAR COAGULATION) PANEL
FIBRINOGEN: 195 mg/dL — AB (ref 204–475)
PROTHROMBIN TIME: 18.5 s — AB (ref 11.6–15.2)

## 2013-09-26 LAB — HEMOGLOBIN A1C
HEMOGLOBIN A1C: 6.5 % — AB (ref <5.7)
MEAN PLASMA GLUCOSE: 140 mg/dL — AB (ref <117)

## 2013-09-26 LAB — CK: Total CK: 46 U/L (ref 7–177)

## 2013-09-26 LAB — PREPARE RBC (CROSSMATCH)

## 2013-09-26 LAB — OCCULT BLOOD X 1 CARD TO LAB, STOOL: Fecal Occult Bld: NEGATIVE

## 2013-09-26 LAB — MRSA PCR SCREENING: MRSA BY PCR: NEGATIVE

## 2013-09-26 MED ORDER — ONDANSETRON HCL 4 MG/2ML IJ SOLN
INTRAMUSCULAR | Status: DC | PRN
  Administered 2013-09-26: 4 mg via INTRAVENOUS

## 2013-09-26 MED ORDER — POTASSIUM CHLORIDE 10 MEQ/50ML IV SOLN
10.0000 meq | INTRAVENOUS | Status: AC
  Administered 2013-09-26 (×2): 10 meq via INTRAVENOUS
  Filled 2013-09-26: qty 50

## 2013-09-26 MED ORDER — INSULIN ASPART 100 UNIT/ML ~~LOC~~ SOLN
0.0000 [IU] | Freq: Three times a day (TID) | SUBCUTANEOUS | Status: DC
  Administered 2013-09-26: 3 [IU] via SUBCUTANEOUS

## 2013-09-26 MED ORDER — PHENYLEPHRINE HCL 10 MG/ML IJ SOLN
10.0000 mg | INTRAVENOUS | Status: DC | PRN
  Administered 2013-09-25: 50 ug/min via INTRAVENOUS

## 2013-09-26 MED ORDER — CETYLPYRIDINIUM CHLORIDE 0.05 % MT LIQD
7.0000 mL | Freq: Four times a day (QID) | OROMUCOSAL | Status: DC
  Administered 2013-09-26 – 2013-10-11 (×59): 7 mL via OROMUCOSAL

## 2013-09-26 MED ORDER — POTASSIUM CHLORIDE 10 MEQ/50ML IV SOLN
INTRAVENOUS | Status: AC
  Filled 2013-09-26: qty 50

## 2013-09-26 MED ORDER — STROKE: EARLY STAGES OF RECOVERY BOOK
Freq: Once | Status: DC
  Filled 2013-09-26: qty 1

## 2013-09-26 MED ORDER — FENTANYL CITRATE 0.05 MG/ML IJ SOLN
INTRAMUSCULAR | Status: AC
  Administered 2013-09-26: 100 ug
  Filled 2013-09-26: qty 2

## 2013-09-26 MED ORDER — ALBUMIN HUMAN 5 % IV SOLN
INTRAVENOUS | Status: AC
  Administered 2013-09-26: 12.5 g
  Filled 2013-09-26: qty 250

## 2013-09-26 MED ORDER — DOPAMINE-DEXTROSE 3.2-5 MG/ML-% IV SOLN
INTRAVENOUS | Status: AC
  Filled 2013-09-26: qty 250

## 2013-09-26 MED ORDER — NICARDIPINE HCL IN NACL 20-0.86 MG/200ML-% IV SOLN
5.0000 mg/h | INTRAVENOUS | Status: DC
  Administered 2013-09-26: 3 mg/h via INTRAVENOUS
  Administered 2013-09-26: 5 mg/h via INTRAVENOUS
  Administered 2013-09-27: 6 mg/h via INTRAVENOUS
  Administered 2013-09-27: 3 mg/h via INTRAVENOUS
  Administered 2013-09-27: 4 mg/h via INTRAVENOUS
  Administered 2013-09-27 – 2013-09-28 (×2): 2 mg/h via INTRAVENOUS
  Filled 2013-09-26 (×7): qty 200

## 2013-09-26 MED ORDER — ONDANSETRON HCL 4 MG/2ML IJ SOLN: 4.0000 mg | Freq: Four times a day (QID) | INTRAMUSCULAR | Status: DC | PRN

## 2013-09-26 MED ORDER — SODIUM CHLORIDE 0.9 % IV BOLUS (SEPSIS)
500.0000 mL | Freq: Once | INTRAVENOUS | Status: AC
Start: 2013-09-26 — End: 2013-09-26

## 2013-09-26 MED ORDER — HEPARIN SODIUM (PORCINE) 5000 UNIT/ML IJ SOLN: 5000.0000 [IU] | Freq: Three times a day (TID) | INTRAMUSCULAR | Status: DC

## 2013-09-26 MED ORDER — FENTANYL CITRATE 0.05 MG/ML IJ SOLN
INTRAMUSCULAR | Status: DC | PRN
  Administered 2013-09-26: 100 ug via INTRAVENOUS
  Administered 2013-09-26 (×3): 50 ug via INTRAVENOUS

## 2013-09-26 MED ORDER — ROCURONIUM BROMIDE 100 MG/10ML IV SOLN
INTRAVENOUS | Status: DC | PRN
  Administered 2013-09-25: 50 mg via INTRAVENOUS

## 2013-09-26 MED ORDER — SODIUM CHLORIDE 0.9 % IV SOLN: Freq: Once | INTRAVENOUS | Status: AC

## 2013-09-26 MED ORDER — PROPOFOL INFUSION 10 MG/ML OPTIME
INTRAVENOUS | Status: DC | PRN
  Administered 2013-09-26: 10 ug/kg/min via INTRAVENOUS

## 2013-09-26 MED ORDER — SODIUM CHLORIDE 0.9 % IV SOLN
8.0000 mg/h | INTRAVENOUS | Status: DC
  Filled 2013-09-26 (×5): qty 80

## 2013-09-26 MED ORDER — EPTIFIBATIDE 2 MG/ML IV SOLN
INTRAVENOUS | Status: DC
Start: 2013-09-26 — End: 2013-09-26
  Filled 2013-09-26: qty 10

## 2013-09-26 MED ORDER — DOPAMINE-DEXTROSE 3.2-5 MG/ML-% IV SOLN
2.0000 ug/kg/min | INTRAVENOUS | Status: DC
  Administered 2013-09-26: 5 ug/kg/min via INTRAVENOUS

## 2013-09-26 MED ORDER — ACETAMINOPHEN 500 MG PO TABS
1000.0000 mg | ORAL_TABLET | Freq: Four times a day (QID) | ORAL | Status: DC | PRN
  Administered 2013-09-26 – 2013-09-28 (×3): 1000 mg via ORAL
  Filled 2013-09-26 (×3): qty 2

## 2013-09-26 MED ORDER — ALBUTEROL SULFATE (2.5 MG/3ML) 0.083% IN NEBU
2.5000 mg | INHALATION_SOLUTION | RESPIRATORY_TRACT | Status: DC
  Administered 2013-09-26 – 2013-09-27 (×7): 2.5 mg via RESPIRATORY_TRACT
  Filled 2013-09-26 (×7): qty 3

## 2013-09-26 MED ORDER — POTASSIUM CHLORIDE 10 MEQ/100ML IV SOLN
10.0000 meq | INTRAVENOUS | Status: AC
  Administered 2013-09-26: 10 meq via INTRAVENOUS
  Filled 2013-09-26 (×2): qty 100

## 2013-09-26 MED ORDER — SODIUM CHLORIDE 0.9 % IV SOLN
80.0000 mg | Freq: Once | INTRAVENOUS | Status: AC
  Administered 2013-09-26: 80 mg via INTRAVENOUS
  Filled 2013-09-26: qty 80

## 2013-09-26 MED ORDER — SODIUM CHLORIDE 0.9 % IV BOLUS (SEPSIS)
500.0000 mL | Freq: Once | INTRAVENOUS | Status: AC
  Administered 2013-09-26: 500 mL via INTRAVENOUS

## 2013-09-26 MED ORDER — SODIUM CHLORIDE 0.9 % IV SOLN
Freq: Once | INTRAVENOUS | Status: AC
  Administered 2013-09-26: 07:00:00 via INTRAVENOUS

## 2013-09-26 MED ORDER — LACTATED RINGERS IV BOLUS (SEPSIS)
1000.0000 mL | Freq: Once | INTRAVENOUS | Status: AC
  Administered 2013-09-26: 1000 mL via INTRAVENOUS

## 2013-09-26 MED ORDER — FENTANYL CITRATE 0.05 MG/ML IJ SOLN
25.0000 ug | INTRAMUSCULAR | Status: DC | PRN
  Administered 2013-09-26 – 2013-09-27 (×2): 100 ug via INTRAVENOUS
  Administered 2013-09-27: 50 ug via INTRAVENOUS
  Administered 2013-09-27: 100 ug via INTRAVENOUS
  Filled 2013-09-26 (×5): qty 2

## 2013-09-26 MED ORDER — ACETAMINOPHEN 650 MG RE SUPP: 650.0000 mg | Freq: Four times a day (QID) | RECTAL | Status: DC | PRN

## 2013-09-26 MED ORDER — SODIUM CHLORIDE 0.9 % IV SOLN
INTRAVENOUS | Status: DC | PRN
  Administered 2013-09-25: 23:00:00 via INTRAVENOUS

## 2013-09-26 MED ORDER — POTASSIUM CHLORIDE 20 MEQ/15ML (10%) PO LIQD
ORAL | Status: AC
  Filled 2013-09-26: qty 30

## 2013-09-26 MED ORDER — INSULIN ASPART 100 UNIT/ML ~~LOC~~ SOLN
0.0000 [IU] | SUBCUTANEOUS | Status: DC
  Administered 2013-09-26: 3 [IU] via SUBCUTANEOUS
  Administered 2013-09-26 (×2): 2 [IU] via SUBCUTANEOUS
  Administered 2013-09-27: 1 [IU] via SUBCUTANEOUS
  Administered 2013-09-27: 2 [IU] via SUBCUTANEOUS
  Administered 2013-09-27: 3 [IU] via SUBCUTANEOUS
  Administered 2013-09-27: 2 [IU] via SUBCUTANEOUS
  Administered 2013-09-27: 1 [IU] via SUBCUTANEOUS
  Administered 2013-09-27: 2 [IU] via SUBCUTANEOUS
  Administered 2013-09-28: 1 [IU] via SUBCUTANEOUS
  Administered 2013-09-28: 2 [IU] via SUBCUTANEOUS
  Administered 2013-09-28: 3 [IU] via SUBCUTANEOUS
  Administered 2013-09-28: 1 [IU] via SUBCUTANEOUS
  Administered 2013-09-28: 2 [IU] via SUBCUTANEOUS
  Administered 2013-09-28: 1 [IU] via SUBCUTANEOUS
  Administered 2013-09-28 – 2013-09-29 (×2): 2 [IU] via SUBCUTANEOUS

## 2013-09-26 MED ORDER — ARTIFICIAL TEARS OP OINT
TOPICAL_OINTMENT | OPHTHALMIC | Status: DC | PRN
  Administered 2013-09-25: 1 via OPHTHALMIC

## 2013-09-26 MED ORDER — VITAL HIGH PROTEIN PO LIQD
1000.0000 mL | ORAL | Status: DC
  Administered 2013-09-26: 23:00:00
  Administered 2013-09-26: 1000 mL
  Administered 2013-09-26: 19:00:00
  Filled 2013-09-26 (×3): qty 1000

## 2013-09-26 MED ORDER — CHLORHEXIDINE GLUCONATE 0.12 % MT SOLN
15.0000 mL | Freq: Two times a day (BID) | OROMUCOSAL | Status: DC
Start: 2013-09-26 — End: 2013-10-11
  Administered 2013-09-26 – 2013-10-11 (×31): 15 mL via OROMUCOSAL
  Filled 2013-09-26 (×27): qty 15

## 2013-09-26 MED ORDER — SODIUM CHLORIDE 0.9 % IV SOLN
INTRAVENOUS | Status: DC
  Administered 2013-09-27 – 2013-10-04 (×4): via INTRAVENOUS

## 2013-09-26 MED ORDER — SODIUM CHLORIDE 0.9 % IV SOLN
Freq: Once | INTRAVENOUS | Status: AC
  Administered 2013-09-26: 04:00:00 via INTRAVENOUS

## 2013-09-26 MED ORDER — HEPARIN SODIUM (PORCINE) 1000 UNIT/ML IJ SOLN
INTRAMUSCULAR | Status: DC | PRN
  Administered 2013-09-26: 2000 [IU] via INTRAVENOUS

## 2013-09-26 MED ORDER — SODIUM CHLORIDE 0.9 % IV SOLN
INTRAVENOUS | Status: DC
  Administered 2013-09-26: 03:00:00 via INTRAVENOUS

## 2013-09-26 MED ORDER — POTASSIUM CHLORIDE 20 MEQ/15ML (10%) PO LIQD
40.0000 meq | Freq: Once | ORAL | Status: AC
  Administered 2013-09-26: 40 meq
  Filled 2013-09-26: qty 30

## 2013-09-26 MED ORDER — NITROGLYCERIN 1 MG/10 ML FOR IR/CATH LAB
INTRA_ARTERIAL | Status: AC
  Filled 2013-09-26: qty 10

## 2013-09-26 NOTE — Anesthesia Preprocedure Evaluation (Signed)
Anesthesia Evaluation  Patient identified by MRN, date of birth, ID band Patient confused    Reviewed: Unable to perform ROS - Chart review only  Airway      Comment: Not evaluated Dental   Pulmonary          Cardiovascular     Neuro/Psych    GI/Hepatic   Endo/Other    Renal/GU      Musculoskeletal   Abdominal   Peds  Hematology   Anesthesia Other Findings Patient confused and moving uncontrollably on procedure table, left bka and piv in place. Last seen normal at 1700  Reproductive/Obstetrics                           Anesthesia Physical Anesthesia Plan  ASA: IV and emergent  Anesthesia Plan: General   Post-op Pain Management:    Induction: Intravenous, Rapid sequence and Cricoid pressure planned  Airway Management Planned: Oral ETT  Additional Equipment: Arterial line  Intra-op Plan:   Post-operative Plan: Post-operative intubation/ventilation  Informed Consent:   Only emergency history available  Plan Discussed with: CRNA and Surgeon  Anesthesia Plan Comments:         Anesthesia Quick Evaluation

## 2013-09-26 NOTE — Progress Notes (Addendum)
FemStop device pressure released. Device still in place. Pedal pulses still absent via doppler. Will continue to monitor

## 2013-09-26 NOTE — Progress Notes (Addendum)
FemStop device removed Per Dr Tyson AliasFeinstein for concerns of lower leg ischemia. Pressure applied, two 4x4 gauzes and pressure applied. Pedal pulse present via doppler.  Will continue to monitor.

## 2013-09-26 NOTE — Progress Notes (Signed)
1st puncture 2359

## 2013-09-26 NOTE — Consult Note (Signed)
PULMONARY  / CRITICAL CARE MEDICINE CONSULTATION  Name: Victoria West MRN: [id number] DOB: Nov 20, 1924  PHYSICIAN REQUESTING CONSULT: Noel Christmas  REASON FOR CONSULT: Vent and medical management     ADMISSION DATE:  09/25/2013  CHIEF COMPLAINT:  Code stroke  BRIEF PATIENT DESCRIPTION: 80 year old woman with unknown history presents from [facility] with right sided paresis and aphasia, s/p mechanical thrombolysis of occluded inferior division of left MCA.  Complicated by hematoma, asystolic arrest, vent remains.  SIGNIFICANT EVENTS / STUDIES:  1. 8/15: Arrived in ED as code stroke 2. 8/16: CT head without acute findings; atrophy, chronic small vessel disease noted 3. 8/16: IR procedure with mechanical thrombolysis of occluded inferior division of left MCA 4. Asystolic arrest from hemorrhagic shock / hematoma formation  LINES / TUBES: 8/16 left [place]>>> 8/15 ETT>>> 8/15 [place] left rad>>>  CULTURES: 1. None  ANTIBIOTICS: 1. None  Subjective: Off dopamine  PHYSICAL EXAM VITAL SIGNS: Temp:  [94.3 F (34.6 C)-96.6 F (35.9 C)] 96.6 F (35.9 C) (08/16 1000) Pulse Rate:  [46-106] 68 (08/16 1000) Resp:  [13-31] 18 (08/16 1000) BP: (72-201)/(43-95) 151/79 mmHg (08/16 1000) SpO2:  [98 %-100 %] 100 % (08/16 1000) Arterial Line BP: (76-198)/(36-78) 168/65 mmHg (08/16 1000) FiO2 (%):  [40 %-60 %] 40 % (08/16 0846) Weight:  [49.3 kg (108 lb 11 oz)-54.432 kg (120 lb)] 49.3 kg (108 lb 11 oz) (08/16 0200)   VENTILATOR SETTINGS: Vent Mode:  [-] PRVC FiO2 (%):  [40 %-60 %] 40 % Set Rate:  [14 bmp] 14 bmp Vt Set:  [420 mL] 420 mL PEEP:  [5 cmH20] 5 cmH20 Plateau Pressure:  [14 cmH20-16 cmH20] 15 cmH20 : PE General: intubated sedated Neuro: pupil unequal rt 2 left 3, limited reaction, moves left arm, possible follows commands to verbal, not consistent HEENT: jvd flat PULM: reduced bases CV: s1 s2 SR rrr no r GI: soft, bs hypo, hematoma left large palpable Extremities: BKA  right, gen edema  INS/OUTS: Intake/Output     08/15 0701 - 08/16 0700 08/16 0701 - 08/17 0700   I.V. (mL/kg) 1271.2 (25.8) 242.6 (4.9)   Blood 335    IV Piggyback 400    Total Intake(mL/kg) 2006.2 (40.7) 242.6 (4.9)   Urine (mL/kg/hr) 525 485 (2.8)   Total Output 525 485   Net +1481.2 -242.4          CLINICAL DECISION MAKING  CBC Recent Labs     09/26/13  0209  09/26/13  0330  09/26/13  0620  09/26/13  0630  WBC  3.3*  6.9  9.9   --   HGB  7.7*  7.0*  9.5*   --   HCT  24.0*  21.8*  28.3*   --   PLT  241  240  191  191    Coag's Recent Labs     09/25/13  2225  09/26/13  0209  09/26/13  0630  APTT  24  152*  29  INR  1.04  1.39  1.54*    BMET Recent Labs     09/25/13  2225  09/25/13  2232  09/26/13  0330  NA  138  137  139  K  3.7  3.3*  2.7*  CL  101  106  109  CO2  21   --   17*  BUN  25*  23  24*  CREATININE  1.32*  1.50*  1.17*  GLUCOSE  164*  157*  262*  Electrolytes Recent Labs     09/25/13  2225  09/26/13  0330  CALCIUM  8.7  7.2*    Sepsis Markers No results found for this basename: LACTICACIDVEN, PROCALCITON, O2SATVEN,  in the last 72 hours  ABG Recent Labs     09/26/13  0400  PHART  7.412  PCO2ART  30.5*  PO2ART  296.0*    Liver Enzymes Recent Labs     09/25/13  2225  09/26/13  0330  AST  13  7  ALT  <5  <5  ALKPHOS  90  60  BILITOT  0.2*  <0.2*  ALBUMIN  3.0*  2.0*    Cardiac Enzymes No results found for this basename: TROPONINI, PROBNP,  in the last 72 hours  Glucose Recent Labs     09/25/13  2223  09/26/13  0351  09/26/13  0733  09/26/13  0936  GLUCAP  145*  214*  201*  206*    Imaging Ct Head Wo Contrast  09/26/2013   CLINICAL DATA:  Post IR procedure. Right facial droop. Slurred speech.  EXAM: CT HEAD WITHOUT CONTRAST  TECHNIQUE: Contiguous axial images were obtained from the base of the skull through the vertex without intravenous contrast.  COMPARISON:  09/25/2013  FINDINGS: Intravascular contrast  material noted following interventional procedure. Atrophy, chronic microvascular changes again noted. No acute infarction. No hemorrhage. No hydrocephalus. No acute calvarial abnormality.  IMPRESSION: No acute intracranial abnormality. Atrophy, chronic small vessel disease.   Electronically Signed   By: Charlett Nose M.D.   On: 09/26/2013 01:47   Ct Head (brain) Wo Contrast  09/25/2013   CLINICAL DATA:  Code stroke. Right-sided facial droop, left-sided gaze and slurred speech.  EXAM: CT HEAD WITHOUT CONTRAST  TECHNIQUE: Contiguous axial images were obtained from the base of the skull through the vertex without intravenous contrast.  COMPARISON:  None.  FINDINGS: There is no evidence of acute infarction, mass lesion, or intra- or extra-axial hemorrhage on CT.  Prominence of the ventricles and sulci reflects mild to moderate cortical volume loss. Diffuse periventricular and subcortical white matter change likely reflects small vessel ischemic microangiopathy.  The brainstem and fourth ventricle are within normal limits. The basal ganglia are unremarkable in appearance. The cerebral hemispheres demonstrate grossly normal gray-white differentiation. No mass effect or midline shift is seen.  There is no evidence of fracture; visualized osseous structures are unremarkable in appearance. The orbits are within normal limits. The paranasal sinuses and mastoid air cells are well-aerated. No significant soft tissue abnormalities are seen.  IMPRESSION: 1. No acute intracranial pathology seen on CT. 2. Mild to moderate cortical volume loss and diffuse small vessel ischemic microangiopathy.  These results were called by telephone at the time of interpretation on 09/25/2013 at 10:36 pm to Dr. Roseanne Reno, who verbally acknowledged these results.   Electronically Signed   By: Roanna Raider M.D.   On: 09/25/2013 22:38   Dg Chest Port 1 View  09/26/2013   CLINICAL DATA:  Endotracheal tube and central line placement.  EXAM: PORTABLE  CHEST - 1 VIEW  COMPARISON:  None.  FINDINGS: The patient's endotracheal tube is seen ending 3-4 cm above the carina. A left subclavian line is noted ending about the distal SVC. An enteric tube is noted extending below the diaphragm. The side port is noted ending about the distal esophagus.  The lungs are well-aerated and clear. There is no evidence of focal opacification, pleural effusion or pneumothorax.  The cardiomediastinal silhouette is borderline  enlarged. No acute osseous abnormalities are seen. An external pacing pad is noted.  IMPRESSION: 1. Endotracheal tube seen ending 3-4 cm above the carina. 2. Left subclavian line seen ending about the distal SVC. 3. Enteric tube noted extending below the diaphragm, though the side port is seen ending about the distal esophagus. 4. No acute cardiopulmonary process seen; borderline cardiomegaly noted.   Electronically Signed   By: Roanna RaiderJeffery  Chang M.D.   On: 09/26/2013 05:47    EKG: Bradycardia (sinus vs. Ectopic), 1st degree AV block, Q waves in V1-V4 CXR: N/A - ordered  ASSESSMENT / PLAN:   PULMONARY A:  Intubated for airway protection in setting of CVA and procedure At risk TRALI P:   ABg reviewed, keep same MV Off pressors, consider SBT attempt, cpap 5 ps 5, goal 2 hrs Neurostatus does not support extubation pcxr in am   CARDIOVASCULAR A:  CAD as evidenced by EKG Bradycardia with 1st degree AV block Hypotension S/p asystolic arrest secondary to hemorrhagic shock NOW HTN developing   P:  Cbc frequent q8h, may drop again given size hematoma and require further Tx Echo pending No aspirin Trop noted, repeat in am cvp Dopamine now off, map goal 75-80 Now HTN, consider map 75-105, sys 180  RENAL A:  Acute kidney injury, unknown baseline creatinine Small NONAG  Hypokalemia  P:   k supp, supp further recheck Reduce fluids to 75 total Avoid free water, brain edema risk May need bicarb for NON AG if  worsen  GASTROINTESTINAL A: No blood noted now  P:   Start feeds ppi No GI bleeding  HEMATOLOGIC A: Acute blood loss due to hematoma at groin site  Small consumptive coagulopathy  P:   Cbc to q8h Consider CT abdo/pelvis define size, once BP remains more stable Hold anticoagulation scd to one leg Keep inr less 1.5 Repeat in am  If fibrinogen less 100, add cryo  INFECTIOUS A: no evidence of infection  P:   Follow fever curve  ENDOCRINE A: Diabetes Possible hyperthyroidism based on med list (methimazole)  P:   Check TSH and free T4, hold methimazole Sliding scale insulin  NEUROLOGIC A:  Altered mental status Acute CVA  R/o anoxia  P:   PRN fentanyl, hold other sedation Post-CVA orders as per neuro team See cvs Consider repeat CT head May need eeg, will d/w neuro  Son French Anaracy, phone [phone] or [phone], updated  TODAY'S SUMMARY: cbc q8h, may need ct for retroper contribution, off dop for now, wean, no extubation \ I have personally obtained a history, examined the patient, evaluated laboratory and imaging results, formulated the assessment and plan and placed orders.  CRITICAL CARE: The patient is critically ill with multiple organ systems failure and requires high complexity decision making for assessment and support, frequent evaluation and titration of therapies, application of advanced monitoring technologies and extensive interpretation of multiple databases. Critical Care Time devoted to patient care services described in this note is 40 minutes.   Mcarthur Rossettianiel J. Tyson AliasFeinstein, MD, FACP Pgr: [phone] [facility]

## 2013-09-26 NOTE — Progress Notes (Signed)
CRITICAL VALUE ALERT  Critical value received:  Troponin 0.45  Date of notification:  [id number]  Time of notification:  1322  Critical value read back: yes  Nurse who received alert:  Jarome LamasKatie Kadyn Chovan RN  MD notified (1st page):  Dr Tyson AliasFeinstein  Time of first page:  1350  MD notified (2nd page):  Time of second page:  Responding MD:  Dr Tyson AliasFeinstein  Time MD responded:  [id number]

## 2013-09-26 NOTE — Procedures (Signed)
S/P lt common carotid arteriogram,followed my mechanical thrombolysis  of occluded inf division of LT MCA   using a microguidewire

## 2013-09-26 NOTE — Anesthesia Procedure Notes (Signed)
Procedure Name: Intubation Date/Time: 09/25/2013 10:43 PM Performed by: Julianne RiceBILOTTA, Effie Janoski Z Pre-anesthesia Checklist: Patient identified, Patient being monitored, Emergency Drugs available, Timeout performed and Suction available Patient Re-evaluated:Patient Re-evaluated prior to inductionOxygen Delivery Method: Circle system utilized Intubation Type: IV induction, Rapid sequence and Cricoid Pressure applied Laryngoscope Size: Mac and 3 Grade View: Grade II Tube type: Oral Tube size: 7.5 mm Number of attempts: 1 Airway Equipment and Method: Stylet Placement Confirmation: ETT inserted through vocal cords under direct vision,  breath sounds checked- equal and bilateral and positive ETCO2 Secured at: 22 cm Tube secured with: Tape Dental Injury: Teeth and Oropharynx as per pre-operative assessment

## 2013-09-26 NOTE — Procedures (Signed)
Central Venous Catheter Insertion Procedure Note Victoria MorisKatie West [id number] 08-22-1924  Procedure: Insertion of Central Venous Catheter Indications: Assessment of intravascular volume, Drug and/or fluid administration and Frequent blood sampling  Procedure Details Consent: Risks of procedure as well as the alternatives and risks of each were explained to the (patient/caregiver).  Consent for procedure obtained. Time Out: Verified patient identification, verified procedure, site/side was marked, verified correct patient position, special equipment/implants available, medications/allergies/relevent history reviewed, required imaging and test results available.  Performed  Maximum sterile technique was used including antiseptics, cap, gloves, gown, hand hygiene, mask and sheet. Skin prep: Chlorhexidine; local anesthetic administered A antimicrobial bonded/coated triple lumen catheter was placed in the left subclavian vein using the Seldinger technique.  Evaluation Blood flow good Complications: No apparent complications Patient did tolerate procedure well. Chest X-ray ordered to verify placement.  CXR: pending.  Victoria CoxJudy Marai West, M.D. Pulmonary and Critical Care Medicine Pager [phone] Call E-link with questions [phone] 09/26/2013, 5:01 AM

## 2013-09-26 NOTE — Progress Notes (Signed)
0200: Patient arrived at unit. Hematoma was identified at L puncture side. Reporting off R.N. made aware at the  Bedside. Dr. Corliss Skainseveshwar notifed. Orders to hold pressure for 20-30 minutes, place 10lb sandbag, keep HOB flat and draw STAT CBC.  0209: CBC 7.7 Patient hypotensive and unresponsive. Propofol turned off. CCM notified.  0300: Rectal temperature 94.3. Black tarry stools identified. Occult stool tested. 500cc NS bolus given. Bear hugger placed. Hematoma larger. Dr. Corliss Skainseveshwar paged. Dr. Roseanne RenoStewart and CCM updated.   0330: CBC 7.0 and K+ 2.7.  0422: Patient became bradycardic and then went asystole. Compressions started. NSR regained. Hematoma continues to enlarge. Manual pressure held.   0445: Blood started.   0515: 2nd unit of blood started.  [id number]: Patient hypotensive and hematoma now very large. Manual pressure started. Emergent blood and FFP given. 1 unit RBC: [id number]  1 unit FFP: [id number]

## 2013-09-26 NOTE — Consult Note (Addendum)
PULMONARY  / CRITICAL CARE MEDICINE CONSULTATION  Name: Shaterra Sanzone MRN: [id number] DOB: [birth year]  PHYSICIAN REQUESTING CONSULT: Noel Christmas  REASON FOR CONSULT: Vent and medical management     ADMISSION DATE:  09/25/2013  CHIEF COMPLAINT:  Code stroke  BRIEF PATIENT DESCRIPTION: 80 year old woman with unknown history presents from [facility] SNF with right sided paresis and aphasia, s/p mechanical thrombolysis of occluded inferior division of left MCA.  Patient is sedated on vent and unable to provide history.  Her son French Ana has limited knowledge of her medical history.   SIGNIFICANT EVENTS / STUDIES:  1. 8/15: Arrived in ED as code stroke 2. 8/16: CT head without acute findings; atrophy, chronic small vessel disease noted 3. 8/16: IR procedure with mechanical thrombolysis of occluded inferior division of left MCA  LINES / TUBES: 1. PIV x2  CULTURES: 1. None  ANTIBIOTICS: 1. None  HISTORY OF PRESENT ILLNESS:  Ms. Alberts is an 80 year old African American woman who presented with right sided paresis and aphasia from her nursing home, where she was last noted to be normal at 1700 on the day of admission and was outside the window of TPA; therefore, she was taken to IR for intervention.  Her son reports that prior to this, she had never had stroke symptoms.  He also denies her having issues with her heart.  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  She has not had any recent medical complaints other than a wound on her left lower extremity, which occurred after an injury.     PAST MEDICAL HISTORY : unfortunately no documents from SNF are available and patient is unable to provide history.  Prior notes state she has no significant history.  Based on son's information and her medications,  she has: -- Hypertension -- Diabetes Mellitus Type II -- Reflux -- Hyperlipidemia  -- Possible thyroid disease (? Hyperthyroidism) -- Possible dementia -- S/p R BKA   MEDICATIONS: Prior to Admission medications   Medication Sig Start Date End Date Taking? Authorizing Provider  amLODipine (NORVASC) 10 MG tablet Take 10 mg by mouth daily.   Yes Historical Provider, MD  aspirin EC 81 MG tablet Take 81 mg by mouth daily.   Yes Historical Provider, MD  brimonidine-timolol (COMBIGAN) 0.2-0.5 % ophthalmic solution Place 1 drop into both eyes every 12 (twelve) hours.   Yes Historical Provider, MD  brinzolamide (AZOPT) 1 % ophthalmic suspension Place 1 drop into both eyes 2 (two) times daily.   Yes Historical Provider, MD  carvedilol (COREG) 3.125 MG tablet Take 3.125 mg by mouth 2 (two) times daily with a meal.   Yes Historical Provider, MD  dorzolamide-timolol (COSOPT) 22.3-6.8 MG/ML ophthalmic solution Place 1 drop into both eyes 2 (two) times daily.   Yes Historical Provider, MD  estradiol (ESTRACE) 0.1 MG/GM vaginal cream Place 1 Applicatorful vaginally 2 (two) times a week.   Yes Historical Provider, MD  hydrALAZINE (APRESOLINE) 100 MG tablet Take 100 mg by mouth 3 (three) times daily.   Yes Historical Provider, MD  HYDROcodone-acetaminophen (NORCO/VICODIN) 5-325 MG per tablet Take 1 tablet by mouth every 4 (four) hours as needed for moderate pain.   Yes Historical Provider, MD  iron polysaccharides (NIFEREX) 150 MG capsule Take  150 mg by mouth daily.   Yes Historical Provider, MD  isosorbide mononitrate (IMDUR) 60 MG 24 hr tablet Take 60 mg by mouth daily.   Yes Historical Provider, MD  lisinopril (PRINIVIL,ZESTRIL) 20 MG tablet Take 20 mg by mouth daily.   Yes Historical Provider, MD  memantine (NAMENDA) 10 MG tablet Take 10 mg by mouth 2 (two) times daily.   Yes Historical Provider, MD  methazolamide (NEPTAZANE) 25 MG tablet Take 25 mg by mouth 2 (two) times daily.   Yes Historical Provider,  MD  methimazole (TAPAZOLE) 5 MG tablet Take 5 mg by mouth daily. Take ONCE daily on Mon, Wed, & Fri. Take TWICE daily on Tues, Thurs, Sat, & Sun   Yes Historical Provider, MD  omeprazole (PRILOSEC) 20 MG capsule Take 20 mg by mouth daily.   Yes Historical Provider, MD  pilocarpine (PILOCAR) 2 % ophthalmic solution Place 1 drop into both eyes 4 (four) times daily.   Yes Historical Provider, MD  polyethylene glycol (MIRALAX / GLYCOLAX) packet Take 17 g by mouth 2 (two) times daily.   Yes Historical Provider, MD  pravastatin (PRAVACHOL) 10 MG tablet Take 10 mg by mouth at bedtime.   Yes Historical Provider, MD  sertraline (ZOLOFT) 100 MG tablet Take 100 mg by mouth daily. Take with 25mg  to equal total 125mg    Yes Historical Provider, MD  sertraline (ZOLOFT) 25 MG tablet Take 25 mg by mouth daily. Take with 100mg  to equal total 125mg    Yes Historical Provider, MD  Skin Protectants, Misc. (EUCERIN) cream Apply 1 application topically as needed for dry skin.   Yes Historical Provider, MD  Travoprost, BAK Free, (TRAVATAN) 0.004 % SOLN ophthalmic solution Place 1 drop into the right eye at bedtime.   Yes Historical Provider, MD    ALLERGIES: Per son, she has no allergies  FAMILY HISTORY: Her son is unable to provide family history.  SOCIAL HISTORY: Per son, she was a lifetime nonsmoker and did not use ETOH or drugs  REVIEW OF SYSTEMS:  12 point review of systems unable to be obtained due to patient's condition.  Her son has provided some ROS, as above in HPI  PHYSICAL EXAM VITAL SIGNS: Pulse Rate:  [53-68] 60 (08/16 0230) Resp:  [14-31] 16 (08/16 0225) BP: (72-201)/(43-90) 113/62 mmHg (08/16 0230) SpO2:  [98 %-100 %] 100 % (08/16 0230) Arterial Line BP: (76-132)/(36-65) 121/60 mmHg (08/16 0230) FiO2 (%):  [60 %] 60 % (08/16 0159) Weight:  [120 lb (54.432 kg)] 120 lb (54.432 kg) (08/15 2236)   VENTILATOR SETTINGS: Vent Mode:  [-] PRVC FiO2 (%):  [60 %] 60 % Set Rate:  [14 bmp] 14 bmp Vt  Set:  [420 mL] 420 mL PEEP:  [5 cmH20] 5 cmH20 Plateau Pressure:  [16 cmH20] 16 cmH20 : General:  Thin African American woman sedated and intubated on vent Neuro:  Not withdrawing to pain; not initiating breaths on vent. No posturing.  HEENT:  ETT in place. Pupils small, nonreactive. Nares and ears without discharge Neck:  Supple. Trachea midline. Cardiovascular:  Distant heart sounds; bradycardic, regular. Diminished pedal pulse on L. 2+ radial pulses. No JVD Lungs:  CTA bilaterally. No wheezes or crackles Abdomen:  Soft, nondistended, hypoactive bowel sounds.  Large right thigh hematoma present Musculoskeletal:  S/p right BKA.  Skin:  Left lower extremity ulcer (per son, due to injury) has gray film over it but appears superficial; very small stage 2 sacral ulcer   INS/OUTS: Intake/Output  08/15 0701 - 08/16 0700   I.V. (mL/kg) 900 (16.5)   Total Intake(mL/kg) 900 (16.5)   Urine (mL/kg/hr) 425   Total Output 425   Net +475         CLINICAL DECISION MAKING  CBC Recent Labs     09/25/13  2225  09/25/13  2232  09/26/13  0209  WBC  6.9   --   3.3*  HGB  9.9*  11.2*  7.7*  HCT  31.1*  33.0*  24.0*  PLT  332   --   241    Coag's Recent Labs     09/25/13  2225  09/26/13  0209  APTT  24   --   INR  1.04  1.39    BMET Recent Labs     09/25/13  2225  09/25/13  2232  NA  138  137  K  3.7  3.3*  CL  101  106  CO2  21   --   BUN  25*  23  CREATININE  1.32*  1.50*  GLUCOSE  164*  157*    Electrolytes Recent Labs     09/25/13  2225  CALCIUM  8.7    Sepsis Markers No results found for this basename: LACTICACIDVEN, PROCALCITON, O2SATVEN,  in the last 72 hours  ABG No results found for this basename: PHART, PCO2ART, PO2ART,  in the last 72 hours  Liver Enzymes Recent Labs     09/25/13  2225  AST  13  ALT  1174*  ALKPHOS  90  BILITOT  0.2*  ALBUMIN  3.0*    Cardiac Enzymes No results found for this basename: TROPONINI, PROBNP,  in the last 72  hours  Glucose Recent Labs     09/25/13  2223  GLUCAP  145*    Imaging Ct Head Wo Contrast  09/26/2013   CLINICAL DATA:  Post IR procedure. Right facial droop. Slurred speech.  EXAM: CT HEAD WITHOUT CONTRAST  TECHNIQUE: Contiguous axial images were obtained from the base of the skull through the vertex without intravenous contrast.  COMPARISON:  09/25/2013  FINDINGS: Intravascular contrast material noted following interventional procedure. Atrophy, chronic microvascular changes again noted. No acute infarction. No hemorrhage. No hydrocephalus. No acute calvarial abnormality.  IMPRESSION: No acute intracranial abnormality. Atrophy, chronic small vessel disease.   Electronically Signed   By: Charlett Nose M.D.   On: 09/26/2013 01:47   Ct Head (brain) Wo Contrast  09/25/2013   CLINICAL DATA:  Code stroke. Right-sided facial droop, left-sided gaze and slurred speech.  EXAM: CT HEAD WITHOUT CONTRAST  TECHNIQUE: Contiguous axial images were obtained from the base of the skull through the vertex without intravenous contrast.  COMPARISON:  None.  FINDINGS: There is no evidence of acute infarction, mass lesion, or intra- or extra-axial hemorrhage on CT.  Prominence of the ventricles and sulci reflects mild to moderate cortical volume loss. Diffuse periventricular and subcortical white matter change likely reflects small vessel ischemic microangiopathy.  The brainstem and fourth ventricle are within normal limits. The basal ganglia are unremarkable in appearance. The cerebral hemispheres demonstrate grossly normal gray-white differentiation. No mass effect or midline shift is seen.  There is no evidence of fracture; visualized osseous structures are unremarkable in appearance. The orbits are within normal limits. The paranasal sinuses and mastoid air cells are well-aerated. No significant soft tissue abnormalities are seen.  IMPRESSION: 1. No acute intracranial pathology seen on CT. 2. Mild to moderate cortical  volume loss  and diffuse small vessel ischemic microangiopathy.  These results were called by telephone at the time of interpretation on 09/25/2013 at 10:36 pm to Dr. Roseanne Reno, who verbally acknowledged these results.   Electronically Signed   By: Roanna Raider M.D.   On: 09/25/2013 22:38    EKG: Bradycardia (sinus vs. Ectopic), 1st degree AV block, Q waves in V1-V4 CXR: N/A - ordered  ASSESSMENT / PLAN:   PULMONARY A:  Intubated for airway protection in setting of CVA and procedure  P:   Check ABG - wean vent as able CXR pending VAP bundle  CARDIOVASCULAR A:  CAD as evidenced by EKG Bradycardia with 1st degree AV block Hypotension  P:   Cycle cardiac enzymes Get echocardiogram to better assess cardiac function Hold home antihypertensives in the setting of hypotension - may need vasopressors No aspirin given active bleeding and intervention  RENAL A:  Acute kidney injury, unknown baseline creatinine Anion gap metabolic acidosis Hypokalemia  P:   Check Urinalysis Check CK  Check lactate Fluid bolus now Repeat BMET now Replete K cautiously   GASTROINTESTINAL A: ? Melena - GI bleed   P:   Place NG, lavage and start suction Check stool guiac Recheck liver panel (unusual pattern) and consider RUQ ultrasound ADDENDUM: GUIAC NEGATIVE  HEMATOLOGIC A: Acute blood loss due to hematoma at groin site   P:   2u PRBCs STAT IR aware  May need CT abd/pelvis to eval for retroperitoneal bleed Hold anticoagulation  INFECTIOUS A: no evidence of infection  P:   Low threshold to culture/treat  ENDOCRINE A: Diabetes Possible hyperthyroidism based on med list (methimazole)  P:   Check TSH and free T4, hold methimazole Sliding scale insulin  NEUROLOGIC A:  Altered mental status Acute CVA   P:   PRN fentanyl, hold other sedation Post-CVA orders as per neuro team  BEST PRACTICE / DISPOSITION Level of Care:  ICU Primary Service:  neurology Consultants:   IR, GI Code Status:  Full, confirmed with Son French Ana Diet:  NPO DVT Px:  Contraindicated due to active bleeding GI Px:  PPI drip Skin Integrity:  Multiple wounds - managed per nursing Social / Family:  [place], phone [phone] or [phone]  TODAY'S SUMMARY: 80 yr old woman with acute CVA s/p thrombolysis, now with large hematoma, possible GI bleed, blood loss anemia, AKI, on ventilator for airway protection. Son aware of her acute illness and states she is to be full code.   I have personally obtained a history, examined the patient, evaluated laboratory and imaging results, formulated the assessment and plan and placed orders.  CRITICAL CARE: The patient is critically ill with multiple organ systems failure and requires high complexity decision making for assessment and support, frequent evaluation and titration of therapies, application of advanced monitoring technologies and extensive interpretation of multiple databases. Critical Care Time devoted to patient care services described in this note is 90 minutes.    [name], MD Pulmonary and Critical Care Medicine [facility] [place] Pager: [phone]  09/26/2013, 2:32 AM

## 2013-09-26 NOTE — Anesthesia Postprocedure Evaluation (Signed)
  Anesthesia Post-op Note  Patient: Victoria West  Procedure(s) Performed: Procedure(s): RADIOLOGY WITH ANESTHESIA (N/A)  Patient Location: ICU  Anesthesia Type:General  Level of Consciousness: sedated  Airway and Oxygen Therapy: Patient remains intubated per anesthesia plan  Post-op Pain: none  Post-op Assessment: Post-op Vital signs reviewed, Patient's Cardiovascular Status Stable, Respiratory Function Stable and Patent Airway  Post-op Vital Signs: Reviewed and stable  Last Vitals:  56 bpm 126/54 100% intubated  Complications: No apparent anesthesia complications

## 2013-09-26 NOTE — Transfer of Care (Signed)
Immediate Anesthesia Transfer of Care Note  Patient: Victoria West  Procedure(s) Performed: Procedure(s): RADIOLOGY WITH ANESTHESIA (N/A)  Patient Location: NICU  Anesthesia Type:General  Level of Consciousness: Patient remains intubated per anesthesia plan  Airway & Oxygen Therapy: Patient remains intubated per anesthesia plan and Patient placed on Ventilator (see vital sign flow sheet for setting)  Post-op Assessment: Report given to PACU RN  Post vital signs: Reviewed and stable  Complications: No apparent anesthesia complications

## 2013-09-26 NOTE — Progress Notes (Addendum)
FemStop device pressure decreased to 80 absent pedal pulses via doppler; will continue to monitor

## 2013-09-26 NOTE — Progress Notes (Signed)
Subjective: Events noted overnight, s/p mechanical thrombectomy of (L)MCA Bleeding from left groin puncture. Fem-stop put in place. Hematoma apparently extended up left abd and marked with pen. Pt remains intubated, some random eye opening, nothing to command.  Objective: Physical Exam: BP 167/95  Pulse 77  Temp(Src) 95.8 F (35.4 C) (Rectal)  Resp 18  Ht 5\' 4"  (1.626 m)  Wt 108 lb 11 oz (49.3 kg)  BMI 18.65 kg/m2  SpO2 100% Intubated (L)groin with fem-stop in position. Dressing seen appears to be clean and dry. Areas marked by pen are now very soft and has receded significantly per RN. (L)LE and foot cool to touch but no mottling. Able to pick up faint phasic doppler signal at [facility].     Labs: CBC  Recent Labs  09/26/13 0330 09/26/13 0620 09/26/13 0630  WBC 6.9 9.9  --   HGB 7.0* 9.5*  --   HCT 21.8* 28.3*  --   PLT 240 191 191   BMET  Recent Labs  09/25/13 2225 09/25/13 2232 09/26/13 0330  NA 138 137 139  K 3.7 3.3* 2.7*  CL 101 106 109  CO2 21  --  17*  GLUCOSE 164* 157* 262*  BUN 25* 23 24*  CREATININE 1.32* 1.50* 1.17*  CALCIUM 8.7  --  7.2*   LFT  Recent Labs  09/26/13 0330  PROT 4.1*  ALBUMIN 2.0*  AST 7  ALT <5  ALKPHOS 60  BILITOT <0.2*  BILIDIR <0.2  IBILI NOT CALCULATED   PT/INR  Recent Labs  09/26/13 0209 09/26/13 0630  LABPROT 17.1* 18.5*  INR 1.39 1.54*     Studies/Results: Ct Head Wo Contrast  09/26/2013   CLINICAL DATA:  Post IR procedure. Right facial droop. Slurred speech.  EXAM: CT HEAD WITHOUT CONTRAST  TECHNIQUE: Contiguous axial images were obtained from the base of the skull through the vertex without intravenous contrast.  COMPARISON:  09/25/2013  FINDINGS: Intravascular contrast material noted following interventional procedure. Atrophy, chronic microvascular changes again noted. No acute infarction. No hemorrhage. No hydrocephalus. No acute calvarial abnormality.  IMPRESSION: No acute intracranial abnormality.  Atrophy, chronic small vessel disease.   Electronically Signed   By: Charlett NoseKevin  Dover M.D.   On: 09/26/2013 01:47   Ct Head (brain) Wo Contrast  09/25/2013   CLINICAL DATA:  Code stroke. Right-sided facial droop, left-sided gaze and slurred speech.  EXAM: CT HEAD WITHOUT CONTRAST  TECHNIQUE: Contiguous axial images were obtained from the base of the skull through the vertex without intravenous contrast.  COMPARISON:  None.  FINDINGS: There is no evidence of acute infarction, mass lesion, or intra- or extra-axial hemorrhage on CT.  Prominence of the ventricles and sulci reflects mild to moderate cortical volume loss. Diffuse periventricular and subcortical white matter change likely reflects small vessel ischemic microangiopathy.  The brainstem and fourth ventricle are within normal limits. The basal ganglia are unremarkable in appearance. The cerebral hemispheres demonstrate grossly normal gray-white differentiation. No mass effect or midline shift is seen.  There is no evidence of fracture; visualized osseous structures are unremarkable in appearance. The orbits are within normal limits. The paranasal sinuses and mastoid air cells are well-aerated. No significant soft tissue abnormalities are seen.  IMPRESSION: 1. No acute intracranial pathology seen on CT. 2. Mild to moderate cortical volume loss and diffuse small vessel ischemic microangiopathy.  These results were called by telephone at the time of interpretation on 09/25/2013 at 10:36 pm to Dr. Roseanne RenoStewart, who verbally acknowledged these results.  Electronically Signed   By: Roanna Raider M.D.   On: 09/25/2013 22:38   Dg Chest Port 1 View  09/26/2013   CLINICAL DATA:  Endotracheal tube and central line placement.  EXAM: PORTABLE CHEST - 1 VIEW  COMPARISON:  None.  FINDINGS: The patient's endotracheal tube is seen ending 3-4 cm above the carina. A left subclavian line is noted ending about the distal SVC. An enteric tube is noted extending below the diaphragm.  The side port is noted ending about the distal esophagus.  The lungs are well-aerated and clear. There is no evidence of focal opacification, pleural effusion or pneumothorax.  The cardiomediastinal silhouette is borderline enlarged. No acute osseous abnormalities are seen. An external pacing pad is noted.  IMPRESSION: 1. Endotracheal tube seen ending 3-4 cm above the carina. 2. Left subclavian line seen ending about the distal SVC. 3. Enteric tube noted extending below the diaphragm, though the side port is seen ending about the distal esophagus. 4. No acute cardiopulmonary process seen; borderline cardiomegaly noted.   Electronically Signed   By: Roanna Raider M.D.   On: 09/26/2013 05:47    Assessment/Plan: S/p [facility] mechanical thrombectomy (L)groin hematoma, improved/ceased with fem-stop.  Will have to monitor (L)LE/foot for signs of ischemia. She likely has underlying PAD to some extent given (R)BKA and left leg ulcer. Reported to Dr. Corliss Skains, who is aware of all events.   LOS: 1 day    Brayton El PA-C 09/26/2013 8:33 AM

## 2013-09-26 NOTE — Progress Notes (Signed)
Stroke Team Progress Note  HISTORY Victoria West is an 80 y.o. female with a history of hypertension, hypothyroidism, glaucoma and dementia who was brought to the emergency room after being found unable to speak as well as unable to move her right side and gaze to the left side. There is no previous history of stroke. CT scan of her head showed no acute intracranial abnormality. She was last seen well at 1700 today. She was beyond time window for intervention with TPA when she arrived in the emergency room. NIH stroke score was 24.   LSN: 1700 on 09/25/2013  tPA Given: No: Beyond time under for treatment consideration  MRankin: 4  S/P Left common carotid arteriogram,followed by mechanical thrombolysis of occluded inf division of LT MCA using a microguidewire - Dr Corliss Skains 09/26/2013 1:11 AM    SUBJECTIVE Intubated. Not sedated. Keeps eyes closed. Follows some simple commands. No family members present.  OBJECTIVE Most recent Vital Signs: Filed Vitals:   09/26/13 0730 09/26/13 0735 09/26/13 0746 09/26/13 0800  BP: 145/62  170/62 167/95  Pulse: 106  92 77  Temp:  95.7 F (35.4 C) 95.8 F (35.4 C) 95.8 F (35.4 C)  TempSrc:  Rectal  Rectal  Resp: 15  16 18   Height:      Weight:      SpO2: 100%  100% 100%   CBG (last 3)   Recent Labs  09/25/13 2223 09/26/13 0351 09/26/13 0733  GLUCAP 145* 214* 201*    IV Fluid Intake:   . sodium chloride 75 mL/hr at 09/26/13 0700  . sodium chloride    . DOPamine 8 mcg/kg/min (09/26/13 0817)  . niCARDipine    . pantoprozole (PROTONIX) infusion      MEDICATIONS  .  stroke: mapping our early stages of recovery book   Does not apply Once  . albuterol  2.5 mg Nebulization Q4H  . antiseptic oral rinse  7 mL Mouth Rinse QID  . chlorhexidine  15 mL Mouth Rinse BID  . insulin aspart  0-9 Units Subcutaneous TID WC  . lactated ringers  1,000 mL Intravenous Once  . lidocaine      . nitroGLYCERIN       PRN:  acetaminophen, acetaminophen,  ondansetron (ZOFRAN) IV  Diet:  NPO no liquids Activity:  Bedrest DVT Prophylaxis:  Currently on no DVT prophylaxis secondary to large left groin hematoma.  CLINICALLY SIGNIFICANT STUDIES Basic Metabolic Panel:  Recent Labs Lab 09/25/13 2225 09/25/13 2232 09/26/13 0330  NA 138 137 139  K 3.7 3.3* 2.7*  CL 101 106 109  CO2 21  --  17*  GLUCOSE 164* 157* 262*  BUN 25* 23 24*  CREATININE 1.32* 1.50* 1.17*  CALCIUM 8.7  --  7.2*   Liver Function Tests:  Recent Labs Lab 09/25/13 2225 09/26/13 0330  AST 13 7  ALT <5 <5  ALKPHOS 90 60  BILITOT 0.2* <0.2*  PROT 6.3 4.1*  ALBUMIN 3.0* 2.0*   CBC:  Recent Labs Lab 09/25/13 2225  09/26/13 0209 09/26/13 0330 09/26/13 0620 09/26/13 0630  WBC 6.9  --  3.3* 6.9 9.9  --   NEUTROABS 4.7  --  2.2  --   --   --   HGB 9.9*  < > 7.7* 7.0* 9.5*  --   HCT 31.1*  < > 24.0* 21.8* 28.3*  --   MCV 99.7  --  100.4* 99.5 90.1  --   PLT 332  --  241 240 191 191  < > =  values in this interval not displayed. Coagulation:  Recent Labs Lab 09/25/13 2225 09/26/13 0209 09/26/13 0630  LABPROT 13.6 17.1* 18.5*  INR 1.04 1.39 1.54*   Cardiac Enzymes:  Recent Labs Lab 09/26/13 0330  CKTOTAL 46   Urinalysis: No results found for this basename: COLORURINE, APPERANCEUR, LABSPEC, PHURINE, GLUCOSEU, HGBUR, BILIRUBINUR, KETONESUR, PROTEINUR, UROBILINOGEN, NITRITE, LEUKOCYTESUR,  in the last 168 hours Lipid Panel    Component Value Date/Time   CHOL 136 09/26/2013 0330   TRIG 102 09/26/2013 0330   HDL 26* 09/26/2013 0330   CHOLHDL 5.2 09/26/2013 0330   VLDL 20 09/26/2013 0330   LDLCALC 90 09/26/2013 0330   HgbA1C  No results found for this basename: HGBA1C    Urine Drug Screen:   No results found for this basename: labopia, cocainscrnur, labbenz, amphetmu, thcu, labbarb    Alcohol Level: No results found for this basename: ETH,  in the last 168 hours  Ct Head Wo Contrast 09/26/2013    No acute intracranial abnormality. Atrophy, chronic  small vessel disease.      Ct Head (brain) Wo Contrast 09/25/2013    1. No acute intracranial pathology seen on CT.  2. Mild to moderate cortical volume loss and diffuse small vessel ischemic microangiopathy.     Dg Chest Port 1 View 09/26/2013    1. Endotracheal tube seen ending 3-4 cm above the carina.  2. Left subclavian line seen ending about the distal SVC.  3. Enteric tube noted extending below the diaphragm, though the side port is seen ending about the distal esophagus.  4. No acute cardiopulmonary process seen; borderline cardiomegaly noted.       MRI of the brain  pending  MRA of the brain    Carotid Doppler  pending  2D Echocardiogram  Pending  Lower extremity arterial Doppler ( left ) - pending   EKG  Sinus or ectopic atrial rhythm rate 55 beats per minute.  For complete results please see formal report.   Therapy Recommendations pending  Physical Exam   NEUROLOGIC:   MENTAL STATUS: Intubated. Follows occasional simple commands. CRANIAL NERVES: pupils sluggish,extraocular muscles - does not cross midline to the right, does not blink to threat on the right. MOTOR: No movement of the right upper extremity or right BKA. Spontaneous movement on the left. SENSORY: Does not respond to noxious stimuli on the right. COORDINATION: Unable to test     ASSESSMENT Ms. Victoria MorisKatie West is a 80 y.o. female presenting with aphasia and right hemiparesis . TPA was not administered secondary to late presentation. Status post left common carotid arteriogram,followed by mechanical thrombolysis of occluded inf division of LT MCA using a microguidewire - Dr Corliss Skainseveshwar 09/26/2013 1:11 AM. Head CT revealed no acute findings. An MRI is pending. On aspirin 81 mg orally every day prior to admission. Now on no antithrombotics or anticoagulation for secondary stroke prevention secondary to left groin hematoma. Patient with resultant right hemiplegia. Stroke work up underway.   Hypotension -  dopamine  Hematoma and left femoral artery puncture site - currently not on DVT prophylaxis due to bleeding concerns.  SCDs not ordered secondary to right BKA and no palpable pulse left lower extremity.  Anemia - hemoglobin 9.5; hematocrit 28.3 - status post transfusions.  Hypokalemia - 2.7  Bradycardia and asystole early this a.m. with restoration of normal sinus rhythm.  Hyperlipidemia  - Cholesterol 136; LDL 90 - Pravachol prior to admission  Hypertension history  Dementia  Intubated on ventilator  Diabetes mellitus -  hemoglobin A1c pending  Renal insufficiency  Low calcium level - 7.2  Right BKA   Hospital day # 1  TREATMENT/PLAN  Add aspirin 325 mg orally every day for secondary stroke prevention when hematoma is stable.  Await MRI, 2-D echo, carotid Dopplers, left lower extremity Dopplers, and hemoglobin A1c  Await therapy evaluations  Poor prognosis  Appreciate critical care's assistance  Delton See PA-C [facility] Pager [phone] 09/26/2013, 9:04 AM   SIGNED I have personally examined this patient, reviewed notes, independently viewed imaging studies, participated in medical decision making and plan of care. I have made any additions or clarifications directly to the above note. Agree with note above.  This patient is critically ill and at significant risk of neurological worsening, death and care requires constant monitoring of vital signs, hemodynamics,respiratory and cardiac monitoring,review of multiple databases, neurological assessment, discussion with family, other specialists and medical decision making of high complexity.I have made any additions or clarifications directly to the above note. Pauletta Browns    To contact Stroke Continuity provider, please refer to [email or web address]. After hours, contact General Neurology

## 2013-09-27 ENCOUNTER — Inpatient Hospital Stay (HOSPITAL_COMMUNITY): Payer: Medicare Other

## 2013-09-27 ENCOUNTER — Encounter (HOSPITAL_COMMUNITY): Payer: Self-pay | Admitting: Interventional Radiology

## 2013-09-27 DIAGNOSIS — I10 Essential (primary) hypertension: Secondary | ICD-10-CM

## 2013-09-27 DIAGNOSIS — J9600 Acute respiratory failure, unspecified whether with hypoxia or hypercapnia: Secondary | ICD-10-CM | POA: Diagnosis not present

## 2013-09-27 DIAGNOSIS — D62 Acute posthemorrhagic anemia: Secondary | ICD-10-CM

## 2013-09-27 DIAGNOSIS — I369 Nonrheumatic tricuspid valve disorder, unspecified: Secondary | ICD-10-CM

## 2013-09-27 LAB — CBC WITH DIFFERENTIAL/PLATELET
Basophils Absolute: 0 10*3/uL (ref 0.0–0.1)
Basophils Relative: 0 % (ref 0–1)
Eosinophils Absolute: 0 10*3/uL (ref 0.0–0.7)
Eosinophils Relative: 0 % (ref 0–5)
HCT: 21.4 % — ABNORMAL LOW (ref 36.0–46.0)
Hemoglobin: 7.5 g/dL — ABNORMAL LOW (ref 12.0–15.0)
LYMPHS ABS: 1 10*3/uL (ref 0.7–4.0)
LYMPHS PCT: 9 % — AB (ref 12–46)
MCH: 31.4 pg (ref 26.0–34.0)
MCHC: 35 g/dL (ref 30.0–36.0)
MCV: 89.5 fL (ref 78.0–100.0)
Monocytes Absolute: 0.6 10*3/uL (ref 0.1–1.0)
Monocytes Relative: 5 % (ref 3–12)
NEUTROS PCT: 86 % — AB (ref 43–77)
Neutro Abs: 10.1 10*3/uL — ABNORMAL HIGH (ref 1.7–7.7)
PLATELETS: 143 10*3/uL — AB (ref 150–400)
RBC: 2.39 MIL/uL — AB (ref 3.87–5.11)
RDW: 16.8 % — ABNORMAL HIGH (ref 11.5–15.5)
WBC: 11.7 10*3/uL — AB (ref 4.0–10.5)

## 2013-09-27 LAB — GLUCOSE, CAPILLARY
GLUCOSE-CAPILLARY: 221 mg/dL — AB (ref 70–99)
GLUCOSE-CAPILLARY: 140 mg/dL — AB (ref 70–99)
Glucose-Capillary: 147 mg/dL — ABNORMAL HIGH (ref 70–99)
Glucose-Capillary: 161 mg/dL — ABNORMAL HIGH (ref 70–99)
Glucose-Capillary: 166 mg/dL — ABNORMAL HIGH (ref 70–99)
Glucose-Capillary: 186 mg/dL — ABNORMAL HIGH (ref 70–99)

## 2013-09-27 LAB — PROTIME-INR
INR: 1.27 (ref 0.00–1.49)
Prothrombin Time: 15.9 seconds — ABNORMAL HIGH (ref 11.6–15.2)

## 2013-09-27 LAB — CBC
HCT: 22.4 % — ABNORMAL LOW (ref 36.0–46.0)
Hemoglobin: 8 g/dL — ABNORMAL LOW (ref 12.0–15.0)
MCH: 32.1 pg (ref 26.0–34.0)
MCHC: 35.7 g/dL (ref 30.0–36.0)
MCV: 90 fL (ref 78.0–100.0)
PLATELETS: 154 10*3/uL (ref 150–400)
RBC: 2.49 MIL/uL — ABNORMAL LOW (ref 3.87–5.11)
RDW: 17 % — AB (ref 11.5–15.5)
WBC: 13 10*3/uL — AB (ref 4.0–10.5)

## 2013-09-27 LAB — COMPREHENSIVE METABOLIC PANEL
ALT: <5 U/L (ref 0–35)
AST: 11 U/L (ref 0–37)
Albumin: 2.4 g/dL — ABNORMAL LOW (ref 3.5–5.2)
Alkaline Phosphatase: 53 U/L (ref 39–117)
Anion gap: 11 (ref 5–15)
BUN: 22 mg/dL (ref 6–23)
CALCIUM: 7.7 mg/dL — AB (ref 8.4–10.5)
CO2: 19 mEq/L (ref 19–32)
CREATININE: 1.02 mg/dL (ref 0.50–1.10)
Chloride: 112 mEq/L (ref 96–112)
GFR calc non Af Amer: 48 mL/min — ABNORMAL LOW (ref >90)
GFR, EST AFRICAN AMERICAN: 56 mL/min — AB (ref >90)
GLUCOSE: 186 mg/dL — AB (ref 70–99)
Potassium: 3.3 mEq/L — ABNORMAL LOW (ref 3.7–5.3)
Sodium: 142 mEq/L (ref 137–147)
Total Bilirubin: 0.3 mg/dL (ref 0.3–1.2)
Total Protein: 4.6 g/dL — ABNORMAL LOW (ref 6.0–8.3)

## 2013-09-27 LAB — PREPARE FRESH FROZEN PLASMA: Unit division: 0

## 2013-09-27 LAB — TROPONIN I: TROPONIN I: 0.45 ng/mL — AB (ref <0.30)

## 2013-09-27 LAB — FIBRINOGEN: FIBRINOGEN: 318 mg/dL (ref 204–475)

## 2013-09-27 LAB — APTT: APTT: 32 s (ref 24–37)

## 2013-09-27 MED ORDER — PANTOPRAZOLE SODIUM 40 MG PO PACK
40.0000 mg | PACK | Freq: Every day | ORAL | Status: DC
  Administered 2013-09-27 – 2013-09-28 (×2): 40 mg
  Filled 2013-09-27 (×2): qty 20

## 2013-09-27 MED ORDER — POTASSIUM CHLORIDE 20 MEQ/15ML (10%) PO LIQD
20.0000 meq | ORAL | Status: AC
  Administered 2013-09-27 (×2): 20 meq
  Filled 2013-09-27 (×2): qty 15

## 2013-09-27 MED ORDER — POTASSIUM CHLORIDE 20 MEQ/15ML (10%) PO LIQD
40.0000 meq | Freq: Once | ORAL | Status: AC
  Administered 2013-09-27: 40 meq
  Filled 2013-09-27: qty 30

## 2013-09-27 MED ORDER — ALBUTEROL SULFATE (2.5 MG/3ML) 0.083% IN NEBU: 2.5000 mg | INHALATION_SOLUTION | RESPIRATORY_TRACT | Status: DC | PRN

## 2013-09-27 MED ORDER — HALOPERIDOL LACTATE 5 MG/ML IJ SOLN
INTRAMUSCULAR | Status: AC
  Filled 2013-09-27: qty 1

## 2013-09-27 MED ORDER — HYDRALAZINE HCL 20 MG/ML IJ SOLN
10.0000 mg | INTRAMUSCULAR | Status: DC | PRN
  Administered 2013-09-28: 20 mg via INTRAVENOUS
  Administered 2013-09-28 – 2013-09-29 (×4): 40 mg via INTRAVENOUS
  Administered 2013-10-01 (×2): 20 mg via INTRAVENOUS
  Administered 2013-10-01: 40 mg via INTRAVENOUS
  Filled 2013-09-27: qty 2
  Filled 2013-09-27: qty 1
  Filled 2013-09-27 (×2): qty 2
  Filled 2013-09-27 (×2): qty 1
  Filled 2013-09-27 (×3): qty 2

## 2013-09-27 MED ORDER — FENTANYL CITRATE 0.05 MG/ML IJ SOLN: 25.0000 ug | INTRAMUSCULAR | Status: DC | PRN

## 2013-09-27 MED ORDER — HALOPERIDOL LACTATE 5 MG/ML IJ SOLN
5.0000 mg | Freq: Once | INTRAMUSCULAR | Status: AC
  Administered 2013-09-27: 5 mg via INTRAVENOUS

## 2013-09-27 MED ORDER — MIDAZOLAM HCL 2 MG/2ML IJ SOLN
4.0000 mg | INTRAMUSCULAR | Status: DC | PRN
  Administered 2013-09-27: 4 mg via INTRAVENOUS
  Filled 2013-09-27: qty 4

## 2013-09-27 MED ORDER — VITAL HIGH PROTEIN PO LIQD
1000.0000 mL | ORAL | Status: DC
  Administered 2013-09-27: 14:00:00
  Administered 2013-09-27 – 2013-09-28 (×2): 1000 mL
  Administered 2013-09-28: 11:00:00
  Administered 2013-09-29: 1000 mL
  Administered 2013-09-30: 12:00:00
  Administered 2013-09-30: 1000 mL
  Administered 2013-09-30 – 2013-10-01 (×4)
  Administered 2013-10-01: 1000 mL
  Administered 2013-10-01 (×2)
  Administered 2013-10-02 – 2013-10-05 (×5): 1000 mL
  Administered 2013-10-05: 08:00:00
  Administered 2013-10-07 – 2013-10-08 (×2): 1000 mL
  Filled 2013-09-27 (×15): qty 1000

## 2013-09-27 MED ORDER — METOPROLOL TARTRATE 1 MG/ML IV SOLN
2.5000 mg | INTRAVENOUS | Status: DC | PRN
  Administered 2013-09-29: 2.5 mg via INTRAVENOUS
  Filled 2013-09-27: qty 5

## 2013-09-27 NOTE — Progress Notes (Signed)
PT Cancellation Note  Patient Details Name: Victoria West MRN: [id number] DOB: August 04, 1924   Cancelled Treatment:    Reason Eval/Treat Not Completed: Patient not medically ready.    Fabio AsaWerner, Shadiyah Wernli J 09/27/2013, 11:42 AM Charlotte Crumbevon Sholonda Jobst, PT DPT  [phone]

## 2013-09-27 NOTE — Progress Notes (Signed)
Stroke Team Progress Note  HISTORY Victoria West is an 80 y.o. female with a history of hypertension, hypothyroidism, glaucoma and dementia who was brought to the emergency room after being found unable to speak as well as unable to move her right side and gaze to the left side. There is no previous history of stroke. CT scan of her head showed no acute intracranial abnormality. She was last seen well at 1700 today. She was beyond time window for intervention with TPA when she arrived in the emergency room. NIH stroke score was 24.   LSN: 1700 on 09/25/2013  tPA Given: No: Beyond time under for treatment consideration  MRankin: 4  S/P Left common carotid arteriogram,followed by mechanical thrombolysis of occluded inf division of LT MCA using a microguidewire - Dr Corliss Skains 09/26/2013 1:11 AM  SUBJECTIVE Still intubated. Not sedated. Eyes half open. Not following commands. No family members present. Will have MRI done today and likely extubated tomorrow.  OBJECTIVE Most recent Vital Signs: Filed Vitals:   09/27/13 1315 09/27/13 1330 09/27/13 1345 09/27/13 1400  BP: 164/66 169/70 162/76 136/54  Pulse: 100 102 100 68  Temp:      TempSrc:      Resp: 17 18 20 13   Height:      Weight:      SpO2: 98% 97% 99% 100%   CBG (last 3)   Recent Labs  09/27/13 0350 09/27/13 0749 09/27/13 1124  GLUCAP 147* 186* 221*    IV Fluid Intake:   . sodium chloride 10 mL/hr at 09/27/13 1455  . niCARDipine 2 mg/hr (09/27/13 1415)    MEDICATIONS  . antiseptic oral rinse  7 mL Mouth Rinse QID  . chlorhexidine  15 mL Mouth Rinse BID  . feeding supplement (VITAL HIGH PROTEIN)  1,000 mL Per Tube Q24H  . haloperidol lactate      . insulin aspart  0-9 Units Subcutaneous 6 times per day  . pantoprazole sodium  40 mg Per Tube Q1200  . potassium chloride  40 mEq Per Tube Once   PRN:  acetaminophen, acetaminophen, albuterol, fentaNYL, hydrALAZINE, metoprolol, midazolam, ondansetron (ZOFRAN) IV  Diet:  NPO  no liquids Activity:  Bedrest DVT Prophylaxis:  Currently on no DVT prophylaxis secondary to large left groin hematoma. And R LE BKA.  CLINICALLY SIGNIFICANT STUDIES Basic Metabolic Panel:   Recent Labs Lab 09/26/13 0330 09/27/13 0459  NA 139 142  K 2.7* 3.3*  CL 109 112  CO2 17* 19  GLUCOSE 262* 186*  BUN 24* 22  CREATININE 1.17* 1.02  CALCIUM 7.2* 7.7*   Liver Function Tests:   Recent Labs Lab 09/26/13 0330 09/27/13 0459  AST 7 11  ALT <5 <5  ALKPHOS 60 53  BILITOT <0.2* 0.3  PROT 4.1* 4.6*  ALBUMIN 2.0* 2.4*   CBC:  Recent Labs Lab 09/26/13 0209  09/27/13 0459 09/27/13 1116  WBC 3.3*  < > 11.7* 13.0*  NEUTROABS 2.2  --  10.1*  --   HGB 7.7*  < > 7.5* 8.0*  HCT 24.0*  < > 21.4* 22.4*  MCV 100.4*  < > 89.5 90.0  PLT 241  < > 143* 154  < > = values in this interval not displayed. Coagulation:   Recent Labs Lab 09/25/13 2225 09/26/13 0209 09/26/13 0630 09/27/13 0459  LABPROT 13.6 17.1* 18.5* 15.9*  INR 1.04 1.39 1.54* 1.27   Cardiac Enzymes:   Recent Labs Lab 09/26/13 0330 09/26/13 1210 09/26/13 2158 09/27/13 0459  CKTOTAL 46  97  --   --   CKMB  --  4.9*  --   --   TROPONINI  --  0.45* 0.50* 0.45*   Urinalysis: No results found for this basename: COLORURINE, APPERANCEUR, LABSPEC, PHURINE, GLUCOSEU, HGBUR, BILIRUBINUR, KETONESUR, PROTEINUR, UROBILINOGEN, NITRITE, LEUKOCYTESUR,  in the last 168 hours Lipid Panel    Component Value Date/Time   CHOL 136 09/26/2013 0330   TRIG 102 09/26/2013 0330   HDL 26* 09/26/2013 0330   CHOLHDL 5.2 09/26/2013 0330   VLDL 20 09/26/2013 0330   LDLCALC 90 09/26/2013 0330   HgbA1C  Lab Results  Component Value Date   HGBA1C 6.5* 09/26/2013    Urine Drug Screen:   No results found for this basename: labopia,  cocainscrnur,  labbenz,  amphetmu,  thcu,  labbarb    Alcohol Level: No results found for this basename: ETH,  in the last 168 hours  Ct Head Wo Contrast 09/26/2013    No acute intracranial  abnormality. Atrophy, chronic small vessel disease.     Ct Head (brain) Wo Contrast 09/25/2013    1. No acute intracranial pathology seen on CT.  2. Mild to moderate cortical volume loss and diffuse small vessel ischemic microangiopathy.    Dg Chest Port 1 View 09/26/2013    1. Endotracheal tube seen ending 3-4 cm above the carina.  2. Left subclavian line seen ending about the distal SVC.  3. Enteric tube noted extending below the diaphragm, though the side port is seen ending about the distal esophagus.  4. No acute cardiopulmonary process seen; borderline cardiomegaly noted.     MRI of the brain  pending  Carotid Doppler  pending  2D Echocardiogram  - LVEF 70-75%, severe concentric LVH, trileaflet calcified aortic valve with calcified mass on non-coronary cusp and calcification of the tip of the left coronary cusp, no significant stenosis, trivial AI, moderate TR, RVSP 53 (moderate pulmonary hypertension), diastolic dysfunction with indeterminate, but likely elevated LV Filling pressure.  Lower extremity arterial Doppler ( left ) - pending  EKG  Sinus or ectopic atrial rhythm rate 55 beats per minute.  For complete results please see formal report.   Therapy Recommendations pending  Physical Exam   Physical exam  Temp:  [97.4 F (36.3 C)-98.2 F (36.8 C)] 98.2 F (36.8 C) (08/17 1115) Pulse Rate:  [63-137] 68 (08/17 1400) Resp:  [0-31] 13 (08/17 1400) BP: (117-188)/(49-132) 136/54 mmHg (08/17 1400) SpO2:  [90 %-100 %] 100 % (08/17 1400) Arterial Line BP: (93-194)/(39-144) 93/78 mmHg (08/17 1400) FiO2 (%):  [30 %-40 %] 30 % (08/17 1400) Weight:  [121 lb 0.5 oz (54.9 kg)] 121 lb 0.5 oz (54.9 kg) (08/17 0500)  General - thin, well developed, intubated.  Ophthalmologic - not cooperative on exam.  Cardiovascular - Regular rate and rhythm with no murmur.  Extremities - right LE BKA, Left LE wound at left shin and left groin hematoma much improved.  NEUROLOGIC:    MENTAL STATUS: Intubated. Eyes half open, did not follows simple commands. CRANIAL NERVES: pupils sluggish, does not blink to threat on the right, eyes are in the middle position. MOTOR: No movement of the right upper extremity, 1/5 on pain stimulation. RLE BKA, but 2/5 on pain stimulation. Spontaneous movement on the left UE, 2/5 LLE on pain stimulation. SENSORY: seems to respond to pain stimulation bilaterally COORDINATION: Unable to test  ASSESSMENT Victoria West is a 80 y.o. female presenting with aphasia and right hemiparesis . TPA was not administered  secondary to late presentation. Status post left common carotid arteriogram, followed by mechanical thrombolysis of occluded inf division of LT MCA using a microguidewire - Dr Corliss Skainseveshwar 09/26/2013 1:11 AM. Head CT revealed no acute findings. Intubated still. Will have MRI today and likely extubate tomorrow. On aspirin 81 mg orally every day prior to admission. Now on no antithrombotics or anticoagulation for secondary stroke prevention secondary to left groin hematoma. Patient with resultant right hemiplegia.    Hyperlipidemia  - Cholesterol 136; LDL 90 - Pravachol prior to admission, resume after po assess  Hypertension history  Dementia  Intubated on ventilator  Diabetes mellitus - hemoglobin A1c 6.5  Renal insufficiency  Low calcium level - 7.2  Right BKA  PLAN Stroke - MRI pending today. Likely extubated tomorrow. - Initial hypotension on dopamine but now hypertension on cardene, will try to wean off cardene  - Hematoma and left femoral artery puncture site, requiring transfusion - currently not on ASA or DVT prophylaxis due to bleeding concerns. CBC stable today. Will keep monitoring - will add ASA 325 mg orally every day and heparin for DVT prophylaxis once hematoma stable. - SCDs not ordered secondary to right BKA and no palpable pulse left lower extremity. - Anemia - stable, today Hb 8.0. Post transfusion hemoglobin  9.5; hematocrit 28.3. - Hypokalemia - 3.3 today improved. Continue supplement - HR stable today. Bradycardia and asystole yesterday with restoration of normal sinus rhythm. - pending carotid Dopplers, left lower extremity Dopplers - palliative care consult  This patient is critically ill due to left MCA stroke and respiratory failure with numerous comorbidities and at significant risk of neurological worsening, death form cerebral edema, respiratory failure, anemia, bleeding, and infection. This patient's care requires constant monitoring of vital signs, hemodynamics, respiratory and cardiac monitoring, review of multiple databases, neurological assessment, discussion with family, other specialists and medical decision making of high complexity. I spent 45 minutes of neurocritical care time in the care of this patient.  Marvel PlanJindong Larnell Granlund, MD PhD Stroke Neurology 09/27/2013 3:41 PM    To contact Stroke Continuity provider, please refer to [email or web address]. After hours, contact General Neurology

## 2013-09-27 NOTE — Anesthesia Postprocedure Evaluation (Deleted)
  Anesthesia Post-op Note  Patient: Victoria MorisKatie Unterreiner  Procedure(s) Performed: Procedure(s): RADIOLOGY WITH ANESTHESIA (N/A)  Patient Location: ICU  Anesthesia Type:General  Level of Consciousness: sedated  Airway and Oxygen Therapy: Patient remains intubated per anesthesia plan  Post-op Pain: none  Post-op Assessment: Post-op Vital signs reviewed, Patient's Cardiovascular Status Stable, Respiratory Function Stable and Patent Airway  Post-op Vital Signs: Reviewed and stable  Last Vitals:  Filed Vitals:   09/27/13 1630  BP: 174/55  Pulse: 95  Temp: 36.7 C  Resp: 27    Complications: No apparent anesthesia complications

## 2013-09-27 NOTE — Progress Notes (Signed)
INITIAL NUTRITION ASSESSMENT  DOCUMENTATION CODES Per approved criteria  -Not Applicable   INTERVENTION:  Increase Vital HP formula to goal rate of 45 ml/hr to provide 1080 kcals, 94 gm protein, 903 ml of free water RD to follow for nutrition care plan  NUTRITION DIAGNOSIS: Inadequate oral intake related to inability to eat as evidenced by NPO status   Goal: Pt to meet >/= 90% of their estimated nutrition needs   Monitor:  TF regimen & tolerance, respiratory status, weight, labs, I/O's  Reason for Assessment: Consult  80 y.o. female  Admitting Dx:  acute onset of aphasia and right hemiparesis  ASSESSMENT: 80 y.o. Female with a history of hypertension, hypothyroidism, glaucoma and dementia who was brought to the ER after being found unable to speak as well as unable to move her right side and gaze to the left side. There is no previous history of stroke. CT scan of her head showed no acute intracranial abnormality. She was last seen well at 1700 today. She was beyond time window for intervention with TPA when she arrived in the emergency room. NIH stroke score was 24.  Patient is currently intubated on ventilator support MV: 6.8 L/min Temp (24hrs), Avg:97.8 F (36.6 C), Min:97.3 F (36.3 C), Max:98.2 F (36.8 C)   Vital HP formula initiated 8/16 and is currently infusing at 40 ml/hr via OGT providing 960 kcals, 84 gm protein, 803 ml of free water.  RD unable to obtain nutrition hx or complete Nutrition Focused Physical Exam at this time.    RD consulted for TF initiation & management.  Noted Palliative Care Team following.  CWOCN note reviewed.  Height: Ht Readings from Last 1 Encounters:  09/26/13 5\' 4"  (1.626 m)    Weight: Wt Readings from Last 1 Encounters:  09/27/13 121 lb 0.5 oz (54.9 kg)    Ideal Body Weight: 113 lb -- adjusted for BKA  % Ideal Body Weight: 107%  Wt Readings from Last 10 Encounters:  09/27/13 121 lb 0.5 oz (54.9 kg)  09/27/13 121 lb  0.5 oz (54.9 kg)    Usual Body Weight: unable to obtain  % Usual Body Weight: ---  BMI:  21.7 kg/m2 -- adjusted for BKA  Estimated Nutritional Needs: Kcal: 1100-1250 Protein: 80-95 gm Fluid: per MD  Skin:  full thickness left tibial wound  unstageable left heel wound  Diet Order: NPO  EDUCATION NEEDS: -No education needs identified at this time   Intake/Output Summary (Last 24 hours) at 09/27/13 1129 Last data filed at 09/27/13 1117  Gross per 24 hour  Intake 3291.01 ml  Output   1510 ml  Net 1781.01 ml    Labs:   Recent Labs Lab 09/25/13 2225 09/25/13 2232 09/26/13 0330 09/27/13 0459  NA 138 137 139 142  K 3.7 3.3* 2.7* 3.3*  CL 101 106 109 112  CO2 21  --  17* 19  BUN 25* 23 24* 22  CREATININE 1.32* 1.50* 1.17* 1.02  CALCIUM 8.7  --  7.2* 7.7*  GLUCOSE 164* 157* 262* 186*    CBG (last 3)   Recent Labs  09/27/13 0030 09/27/13 0350 09/27/13 0749  GLUCAP 166* 147* 186*    Scheduled Meds: . antiseptic oral rinse  7 mL Mouth Rinse QID  . chlorhexidine  15 mL Mouth Rinse BID  . feeding supplement (VITAL HIGH PROTEIN)  1,000 mL Per Tube Q24H  . haloperidol lactate      . insulin aspart  0-9 Units Subcutaneous 6 times  per day  . pantoprazole sodium  40 mg Per Tube Q1200  . potassium chloride  40 mEq Per Tube Once    Continuous Infusions: . sodium chloride 75 mL/hr at 09/27/13 0041  . niCARDipine 4 mg/hr (09/27/13 1117)    History reviewed. No pertinent past medical history.  History reviewed. No pertinent past surgical history.  Maureen ChattersKatie Marisol Giambra, RD, LDN Pager #: [phone] After-Hours Pager #: [phone]

## 2013-09-27 NOTE — Progress Notes (Signed)
PULMONARY  / CRITICAL CARE MEDICINE CONSULTATION  Name: Victoria West MRN: [id number] DOB: 20-Feb-1924  PHYSICIAN REQUESTING CONSULT: Noel Christmas  REASON FOR CONSULT: Vent and medical management     ADMISSION DATE:  09/25/2013  CHIEF COMPLAINT:  Code stroke  BRIEF PATIENT DESCRIPTION: 80 year old woman with unknown history presents from [facility] with right sided paresis and aphasia, s/p mechanical thrombolysis of occluded inferior division of left MCA.  Complicated by hematoma, asystolic arrest, vent remains.  SIGNIFICANT EVENTS / STUDIES:  1. 8/15: Arrived in ED as code stroke 2. 8/16: CT head without acute findings; atrophy, chronic small vessel disease noted 3. 8/16: IR procedure with mechanical thrombolysis of occluded inferior division of left MCA 4. Asystolic arrest from hemorrhagic shock / hematoma formation  LINES / TUBES: 8/16 left [place] >>> 8/15 ETT >>> 8/15 Al[place] left rad >>>  CULTURES: 1. None  ANTIBIOTICS: 1. None  SUBJ: Off DA. Now requiring nicardipine for hypertension. Tolerates PS 5/5. Not extubated due to planned MRI today. Need to clarify re-intubation status prior to extubation  PHYSICAL EXAM VITAL SIGNS: Temp:  [97.4 F (36.3 C)-98.2 F (36.8 C)] 98.2 F (36.8 C) (08/17 1115) Pulse Rate:  [63-137] 68 (08/17 1400) Resp:  [0-31] 13 (08/17 1400) BP: (117-188)/(49-132) 136/54 mmHg (08/17 1400) SpO2:  [90 %-100 %] 100 % (08/17 1400) Arterial Line BP: (93-194)/(39-144) 93/78 mmHg (08/17 1400) FiO2 (%):  [30 %-40 %] 30 % (08/17 1400) Weight:  [54.9 kg (121 lb 0.5 oz)] 54.9 kg (121 lb 0.5 oz) (08/17 0500)   VENTILATOR SETTINGS: Vent Mode:  [-] CPAP;PSV FiO2 (%):  [30 %-40 %] 30 % Set Rate:  [14 bmp] 14 bmp Vt Set:  [420 mL] 420 mL PEEP:  [5 cmH20] 5 cmH20 Pressure Support:  [5 cmH20] 5 cmH20 Plateau Pressure:  [11 cmH20-19 cmH20] 19 cmH20 : PE General: intubated sedated Neuro: pupil unequal rt 2 left 3, limited reaction, moves left arm,  possible follows commands to verbal, not consistent HEENT: jvd flat PULM: reduced bases CV: s1 s2 SR rrr no r GI: soft, bs hypo, hematoma left large palpable Extremities: BKA right, gen edema  INS/OUTS: Intake/Output     08/16 0701 - 08/17 0700 08/17 0701 - 08/18 0700   I.V. (mL/kg) 2366.9 (43.1) 876.7 (16)   Blood     NG/GT 550 280   IV Piggyback     Total Intake(mL/kg) 2916.9 (53.1) 1156.7 (21.1)   Urine (mL/kg/hr) 1517 (1.2) 775 (1.7)   Emesis/NG output 100 (0.1)    Total Output 1617 775   Net +1299.9 +381.7            CBC Recent Labs     09/26/13  1840  09/27/13  0459  09/27/13  1116  WBC  16.7*  11.7*  13.0*  HGB  9.9*  7.5*  8.0*  HCT  28.0*  21.4*  22.4*  PLT  178  143*  154    Coag's Recent Labs     09/26/13  0209  09/26/13  0630  09/27/13  0459  APTT  152*  29  32  INR  1.39  1.54*  1.27    BMET Recent Labs     09/25/13  2225  09/25/13  2232  09/26/13  0330  09/27/13  0459  NA  138  137  139  142  K  3.7  3.3*  2.7*  3.3*  CL  101  106  109  112  CO2  21   --  17*  19  BUN  25*  23  24*  22  CREATININE  1.32*  1.50*  1.17*  1.02  GLUCOSE  164*  157*  262*  186*    Electrolytes Recent Labs     09/25/13  2225  09/26/13  0330  09/27/13  0459  CALCIUM  8.7  7.2*  7.7*    Sepsis Markers No results found for this basename: LACTICACIDVEN, PROCALCITON, O2SATVEN,  in the last 72 hours  ABG Recent Labs     09/26/13  0400  PHART  7.412  PCO2ART  30.5*  PO2ART  296.0*    Liver Enzymes Recent Labs     09/25/13  2225  09/26/13  0330  09/27/13  0459  AST  13  7  11   ALT  <5  <5  <5  ALKPHOS  90  60  53  BILITOT  0.2*  <0.2*  0.3  ALBUMIN  3.0*  2.0*  2.4*    Cardiac Enzymes Recent Labs     09/26/13  1210  09/26/13  2158  09/27/13  0459  TROPONINI  0.45*  0.50*  0.45*    Glucose Recent Labs     09/26/13  1604  09/26/13  2007  09/27/13  0030  09/27/13  0350  09/27/13  0749  09/27/13  1124  GLUCAP  153*  152*   166*  147*  186*  221*      EKG: Bradycardia (sinus vs. Ectopic), 1st degree AV block, Q waves in V1-V4 CXR: Vague hazy opacity throughout on L - ? Due to rotation  ASSESSMENT / PLAN:   PULMONARY A:  Acute resp failure due to AMS P:   Cont vent support - settings reviewed and/or adjusted Wean in PSV as tolerated Cont vent bundle Daily SBT if/when meets criteria Would prefer to leave intubated until after MRI and until after re-intubation status clarified  CARDIOVASCULAR A:  CAD Bradycardia, resolved Hypotension, resolved S/p asystolic arrest secondary to hemorrhagic shock Hypertension  P:   PRN hydralazine Try to wean off nicardipine  RENAL A:  Acute kidney injury  Hypokalemia P:   Monitor BMET intermittently Monitor I/Os Correct electrolytes as indicated DC IVFs as TFs are at goal  GASTROINTESTINAL A: No acute issues P:   Cont SUP and TFs  HEMATOLOGIC A: Acute blood loss due to hemorrhage at groin site - active bleeding appears resolved Small consumptive coagulopathy, resolved P:   Change CBCs to daily DVT px: not a candidate due to bleeding and AKA Monitor CBC intermittently Transfuse per usual ICU guidelines  INFECTIOUS A: no evidence of infection  P:   Follow fever curve  ENDOCRINE A: Reported hx of DM - not on any meds for this PTA Hyperglycemia - minimal Possible hyperthyroidism (methimazole on med list) Now hypothyroid (TSH 15 8/16) P:   Cont to hold methimazole Cont sensitive scale SSI  NEUROLOGIC A:  Acute CVA  Acute encephalopathy - possible ischemic injury during arrest  P:   Stroke team following    TODAY'S SUMMARY:  Given her advanced age and baseline functional status, it is unlikely that this will end with a good, functional outcome. Per reports, family has unrealistic goals. I have requested Palliative Care consultation for goals of care discussion  I have personally obtained a history, examined the patient,  evaluated laboratory and imaging results, formulated the assessment and plan and placed orders.  CRITICAL CARE: The patient is critically ill with multiple organ systems failure and  requires high complexity decision making for assessment and support, frequent evaluation and titration of therapies, application of advanced monitoring technologies and extensive interpretation of multiple databases. Critical Care Time devoted to patient care services described in this note is 30 minutes.   Billy Fischeravid Simonds, MD ; [facility] Mobile [phone].  After 5:30 PM or weekends, call [phone]

## 2013-09-27 NOTE — Consult Note (Signed)
WOC wound consult note Reason for Consult: Left tibial wound Wound type:full thickness Pressure Ulcer POA: Yes to left heel  Measurement:left tibial: 7cm x 3 cm x 0.1cm with 0.75 cm pocket at 3 o'clock  left heel: 3cm x 3cm unstageable, 100% dry hard eschar without odor or drainage. Wound RUE:AVWUbed:Left tibial: 5% yellow slough, 95% pink Drainage: small amount of serosanguineous drainage, no odor Periwound:macerated Dressing procedure/placement/frequency: left tibial: Aquacel applied into pocket and foam dressing to remainder of wound to provide antimicrobial benefits and absorb drainage. Left heel: best practice to leave wound dry and open to are with heel floated. Charlesetta GaribaldiJody Harris, RN, BSN, MSN-Student Please re-consult if further assistance is needed.  Thank-you,  Cammie Mcgeeawn Yuliya Nova MSN, RN, CWOCN, LaurelesWCN-AP, CNS [phone]

## 2013-09-27 NOTE — Progress Notes (Signed)
SLP Cancellation Note  Patient Details Name: Victoria MorisKatie West MRN: [id number] DOB: 10-20-1924   Cancelled treatment:       Reason Eval/Treat Not Completed: Medical issues which prohibited therapy (Patient on vent. Will f/u 8/18. )   Ferdinand LangoLeah Orvetta Danielski MA, CCC-SLP [phone]    [name] 09/27/2013, 2:00 PM

## 2013-09-27 NOTE — Progress Notes (Signed)
OT Cancellation Note  Patient Details Name: Lianne MorisKatie Jago MRN: [id number] DOB: 07-Oct-1924   Cancelled Treatment:    Reason Eval/Treat Not Completed: Patient not medically ready  [facility] Health St. ElizabethWARD,HILLARY Awais Cobarrubias, OTR/L  [phone] 09/27/2013 09/27/2013, 1:19 PM

## 2013-09-27 NOTE — Progress Notes (Signed)
2 Days Post-Op  Subjective: CVA; L MCA clot retrieval Vent Responds to pain on Left Somewhat on Rt leg/stump  Objective: Vital signs in last 24 hours: Temp:  [97.3 F (36.3 C)-98.2 F (36.8 C)] 98 F (36.7 C) (08/17 0800) Pulse Rate:  [63-137] 113 (08/17 1115) Resp:  [0-31] 22 (08/17 1115) BP: (117-242)/(49-132) 171/67 mmHg (08/17 1115) SpO2:  [90 %-100 %] 99 % (08/17 1115) Arterial Line BP: (110-253)/(39-114) 177/60 mmHg (08/17 1115) FiO2 (%):  [30 %-40 %] 30 % (08/17 1100) Weight:  [54.9 kg (121 lb 0.5 oz)] 54.9 kg (121 lb 0.5 oz) (08/17 0500) Last BM Date: 09/26/13  Intake/Output from previous day: 08/16 0701 - 08/17 0700 In: 2916.9 [I.V.:2366.9; NG/GT:550] Out: 1617 [Urine:1517; Emesis/NG output:100] Intake/Output this shift: Total I/O In: 691.7 [I.V.:531.7; NG/GT:160] Out: 400 [Urine:400]  PE:  Afeb; vss Hgb: 7.5 (9.9) Hct 21.4 (28) INR 1.27 On vent Tries to open eyes to my voice Responds to pain with RN- all 4s Can move Left hand to command L groin NT; soft Hematoma has almost completely resolved L foot 1+ by doppler   Lab Results:   Recent Labs  09/27/13 0459 09/27/13 1116  WBC 11.7* 13.0*  HGB 7.5* 8.0*  HCT 21.4* 22.4*  PLT 143* 154   BMET  Recent Labs  09/26/13 0330 09/27/13 0459  NA 139 142  K 2.7* 3.3*  CL 109 112  CO2 17* 19  GLUCOSE 262* 186*  BUN 24* 22  CREATININE 1.17* 1.02  CALCIUM 7.2* 7.7*   PT/INR  Recent Labs  09/26/13 0630 09/27/13 0459  LABPROT 18.5* 15.9*  INR 1.54* 1.27   ABG  Recent Labs  09/26/13 0400  PHART 7.412  HCO3 19.0*    Studies/Results: Ct Head Wo Contrast  09/26/2013   CLINICAL DATA:  Post IR procedure. Right facial droop. Slurred speech.  EXAM: CT HEAD WITHOUT CONTRAST  TECHNIQUE: Contiguous axial images were obtained from the base of the skull through the vertex without intravenous contrast.  COMPARISON:  09/25/2013  FINDINGS: Intravascular contrast material noted following interventional  procedure. Atrophy, chronic microvascular changes again noted. No acute infarction. No hemorrhage. No hydrocephalus. No acute calvarial abnormality.  IMPRESSION: No acute intracranial abnormality. Atrophy, chronic small vessel disease.   Electronically Signed   By: Charlett NoseKevin  Dover M.D.   On: 09/26/2013 01:47   Ct Head (brain) Wo Contrast  09/25/2013   CLINICAL DATA:  Code stroke. Right-sided facial droop, left-sided gaze and slurred speech.  EXAM: CT HEAD WITHOUT CONTRAST  TECHNIQUE: Contiguous axial images were obtained from the base of the skull through the vertex without intravenous contrast.  COMPARISON:  None.  FINDINGS: There is no evidence of acute infarction, mass lesion, or intra- or extra-axial hemorrhage on CT.  Prominence of the ventricles and sulci reflects mild to moderate cortical volume loss. Diffuse periventricular and subcortical white matter change likely reflects small vessel ischemic microangiopathy.  The brainstem and fourth ventricle are within normal limits. The basal ganglia are unremarkable in appearance. The cerebral hemispheres demonstrate grossly normal gray-white differentiation. No mass effect or midline shift is seen.  There is no evidence of fracture; visualized osseous structures are unremarkable in appearance. The orbits are within normal limits. The paranasal sinuses and mastoid air cells are well-aerated. No significant soft tissue abnormalities are seen.  IMPRESSION: 1. No acute intracranial pathology seen on CT. 2. Mild to moderate cortical volume loss and diffuse small vessel ischemic microangiopathy.  These results were called by telephone at the  time of interpretation on 09/25/2013 at 10:36 pm to Dr. Roseanne Reno, who verbally acknowledged these results.   Electronically Signed   By: Roanna Raider M.D.   On: 09/25/2013 22:38   Dg Chest Port 1 View  09/27/2013   CLINICAL DATA:  Hypoxia  EXAM: PORTABLE CHEST - 1 VIEW  COMPARISON:  September 26, 2013  FINDINGS: Endotracheal tube tip  is 3.6 cm above the carina. Central catheter tip is in the superior vena cava. Nasogastric tube tip and side port are in the stomach region. No pneumothorax. There is atelectatic change in the left base. Elsewhere lungs are clear. Heart is mildly enlarged with pulmonary vascularity within normal limits. No adenopathy.  IMPRESSION: Tube and catheter positions as described without pneumothorax. Left base atelectasis. Elsewhere lungs clear.   Electronically Signed   By: Bretta Bang M.D.   On: 09/27/2013 07:02   Dg Chest Port 1 View  09/26/2013   CLINICAL DATA:  Endotracheal tube and central line placement.  EXAM: PORTABLE CHEST - 1 VIEW  COMPARISON:  None.  FINDINGS: The patient's endotracheal tube is seen ending 3-4 cm above the carina. A left subclavian line is noted ending about the distal SVC. An enteric tube is noted extending below the diaphragm. The side port is noted ending about the distal esophagus.  The lungs are well-aerated and clear. There is no evidence of focal opacification, pleural effusion or pneumothorax.  The cardiomediastinal silhouette is borderline enlarged. No acute osseous abnormalities are seen. An external pacing pad is noted.  IMPRESSION: 1. Endotracheal tube seen ending 3-4 cm above the carina. 2. Left subclavian line seen ending about the distal SVC. 3. Enteric tube noted extending below the diaphragm, though the side port is seen ending about the distal esophagus. 4. No acute cardiopulmonary process seen; borderline cardiomegaly noted.   Electronically Signed   By: Roanna Raider M.D.   On: 09/26/2013 05:47    Anti-infectives: Anti-infectives   None      Assessment/Plan: s/p Procedure(s): RADIOLOGY WITH ANESTHESIA (N/A)  CVA L MCA clot retrieval 8/15 On vent L groin hematoma resolving Will follow   LOS: 2 days    Marrietta Thunder A 09/27/2013

## 2013-09-27 NOTE — Progress Notes (Signed)
  Echocardiogram 2D Echocardiogram has been performed.  Leta JunglingCooper, Marijke Guadiana M 09/27/2013, 10:17 AM

## 2013-09-27 NOTE — Progress Notes (Signed)
Patient NW:GNFAO:Reaghan Maradiaga      DOB: Jan 02, 1925      ZHY:[id number]  Reviewed record and discussed case with the patient's son French Anaracy.  [place] and would like to meet tomorrow when he is here.French Ana.  Tracy is going to talk with his brother and call back with a time to meet 8/18.  French Anaracy told me that if the time comes when his Mom would need Palliative Care and or Hospice care, he would [place] to go [place] .  He spoke with [place] today and they can provide both levels of care.   Aleeza Bellville L. Ladona Ridgelaylor, MD MBA The Palliative Medicine Team at [facility] Team Phone: [phone] Pager: [phone] ( Use team phone after hours)

## 2013-09-27 NOTE — Progress Notes (Signed)
Assisted primary RN with infusing PRBC & IV fluid to support blood pressure.  Pt with large firm hematoma to LLQ & Lt flank.  Dr. Kem KaysKuhn at bedside.  Pressure being held consistently for past hour due to increasing size of hemtoma.  Femstop obtained & applied per MD.  Applied per policy to Lt groin with pressure set for 15mmHg above BP.  Pressure decreased to 80 once BP began to increase & stabilize.  Lt peripheral pulse dopplered weakly prior to applying. MD aware.

## 2013-09-27 NOTE — Progress Notes (Signed)
[facility] FOR AM LAB REPLACEMENT ONLY  The patient does apply for the [facility] Electrolyte Replacment Protocol based on the criteria listed below:   1. Is GFR >/= 40 ml/min? Yes.    Patient's GFR today is 56 2. Is urine output >/= 0.5 ml/kg/hr for the last 6 hours? Yes.   Patient's UOP is 1.0 ml/kg/hr 3. Is BUN < 60 mg/dL? Yes.    Patient's BUN today is 22 4. Abnormal electrolyte(s): K 3.3 5. Ordered repletion with: per protocol 6. If a panic level lab has been reported, has the CCM MD in charge been notified? No..   Physician:    Markus DaftWHELAN, Chauncy Mangiaracina A 09/27/2013 6:08 AM

## 2013-09-28 ENCOUNTER — Inpatient Hospital Stay (HOSPITAL_COMMUNITY): Payer: Medicare Other

## 2013-09-28 DIAGNOSIS — Z515 Encounter for palliative care: Secondary | ICD-10-CM

## 2013-09-28 LAB — GLUCOSE, CAPILLARY
GLUCOSE-CAPILLARY: 188 mg/dL — AB (ref 70–99)
GLUCOSE-CAPILLARY: 129 mg/dL — AB (ref 70–99)
Glucose-Capillary: 121 mg/dL — ABNORMAL HIGH (ref 70–99)
Glucose-Capillary: 140 mg/dL — ABNORMAL HIGH (ref 70–99)
Glucose-Capillary: 182 mg/dL — ABNORMAL HIGH (ref 70–99)
Glucose-Capillary: 223 mg/dL — ABNORMAL HIGH (ref 70–99)

## 2013-09-28 LAB — CBC
HCT: 21.8 % — ABNORMAL LOW (ref 36.0–46.0)
HEMOGLOBIN: 7.8 g/dL — AB (ref 12.0–15.0)
MCH: 31.8 pg (ref 26.0–34.0)
MCHC: 35.8 g/dL (ref 30.0–36.0)
MCV: 89 fL (ref 78.0–100.0)
PLATELETS: 162 10*3/uL (ref 150–400)
RBC: 2.45 MIL/uL — ABNORMAL LOW (ref 3.87–5.11)
RDW: 16.6 % — ABNORMAL HIGH (ref 11.5–15.5)
WBC: 12.6 10*3/uL — ABNORMAL HIGH (ref 4.0–10.5)

## 2013-09-28 LAB — BASIC METABOLIC PANEL
Anion gap: 10 (ref 5–15)
BUN: 21 mg/dL (ref 6–23)
CO2: 21 mEq/L (ref 19–32)
Calcium: 8.4 mg/dL (ref 8.4–10.5)
Chloride: 114 mEq/L — ABNORMAL HIGH (ref 96–112)
Creatinine, Ser: 0.79 mg/dL (ref 0.50–1.10)
GFR, EST AFRICAN AMERICAN: 84 mL/min — AB (ref >90)
GFR, EST NON AFRICAN AMERICAN: 73 mL/min — AB (ref >90)
Glucose, Bld: 201 mg/dL — ABNORMAL HIGH (ref 70–99)
Potassium: 4.1 mEq/L (ref 3.7–5.3)
Sodium: 145 mEq/L (ref 137–147)

## 2013-09-28 MED ORDER — MUPIROCIN 2 % EX OINT: 1.0000 "application " | TOPICAL_OINTMENT | Freq: Two times a day (BID) | CUTANEOUS | Status: DC

## 2013-09-28 MED ORDER — PANTOPRAZOLE SODIUM 40 MG PO TBEC: 40.0000 mg | DELAYED_RELEASE_TABLET | Freq: Every day | ORAL | Status: DC

## 2013-09-28 MED ORDER — CARVEDILOL 3.125 MG PO TABS
3.1250 mg | ORAL_TABLET | Freq: Two times a day (BID) | ORAL | Status: DC
  Administered 2013-09-28 – 2013-09-29 (×2): 3.125 mg
  Filled 2013-09-28 (×4): qty 1

## 2013-09-28 MED ORDER — FREE WATER
100.0000 mL | Freq: Three times a day (TID) | Status: DC
  Administered 2013-09-28 – 2013-10-08 (×24): 100 mL

## 2013-09-28 MED ORDER — AMLODIPINE BESYLATE 10 MG PO TABS
10.0000 mg | ORAL_TABLET | Freq: Every day | ORAL | Status: DC
  Administered 2013-09-28: 10 mg via ORAL
  Filled 2013-09-28: qty 1

## 2013-09-28 MED ORDER — AMLODIPINE BESYLATE 10 MG PO TABS
10.0000 mg | ORAL_TABLET | Freq: Every day | ORAL | Status: DC
  Administered 2013-09-29 – 2013-10-11 (×11): 10 mg
  Filled 2013-09-28 (×11): qty 1

## 2013-09-28 MED ORDER — CHLORHEXIDINE GLUCONATE CLOTH 2 % EX PADS
6.0000 | MEDICATED_PAD | Freq: Every day | CUTANEOUS | Status: DC
  Administered 2013-09-28: 6 via TOPICAL

## 2013-09-28 MED ORDER — CARVEDILOL 3.125 MG PO TABS
3.1250 mg | ORAL_TABLET | Freq: Two times a day (BID) | ORAL | Status: DC
  Administered 2013-09-28: 3.125 mg via ORAL
  Filled 2013-09-28 (×3): qty 1

## 2013-09-28 NOTE — Progress Notes (Signed)
PULMONARY  / CRITICAL CARE MEDICINE   Name: Victoria West MRN: [id number] DOB: 01-May-1924  PHYSICIAN REQUESTING CONSULT: Noel Christmas  REASON FOR CONSULT: Vent and medical management     ADMISSION DATE:  09/25/2013  CHIEF COMPLAINT:  Code stroke  BRIEF PATIENT DESCRIPTION: 80 year old woman with unknown history presents from [facility] SN[facility] with right sided paresis and aphasia, s/p mechanical thrombolysis of occluded inferior division of left MCA.  Complicated by hematoma, asystolic arrest, vent remains.  SIGNIFICANT EVENTS / STUDIES:  8/15: Arrived in ED as code stroke 8/15: CT head without acute findings; atrophy, chronic small vessel disease noted 8/16: IR procedure with mechanical thrombolysis of occluded inferior division of left MCA 8/26 CT head: No acute intracranial abnormality. Atrophy, chronic small vessel disease 8/16 Asystolic arrest from hemorrhagic shock / hematoma formation 8/18 MRI brain: Acute infarct in the posterior portion of the left MCA territory. Small area of acute infarct in the right medial occipital lobe. Severe stenosis distal left M1 segment with occlusion of the posterior segment of the left middle cerebral artery corresponding to acute infarct. Decreased flow in the anterior division of the left middle cerebral artery   LINES / TUBES: Aline left rad 8/15 >> 8/17 ETT 8/15 >> 8/18 left [place] 8/16 >>   CULTURES:   ANTIBIOTICS:   SUBJ: RASS -3 on minimal sedation. Tolerates SBT without respiratory distress  PHYSICAL EXAM VITAL SIGNS: Temp:  [98 F (36.7 C)-99.9 F (37.7 C)] 98.9 F (37.2 C) (08/18 0800) Pulse Rate:  [50-115] 92 (08/18 1130) Resp:  [10-32] 23 (08/18 1130) BP: (136-205)/(49-81) 199/69 mmHg (08/18 1130) SpO2:  [94 %-100 %] 100 % (08/18 1130) Arterial Line BP: (93-177)/(58-144) 93/78 mmHg (08/17 1400) FiO2 (%):  [30 %-100 %] 30 % (08/18 1000) Weight:  [55.6 kg (122 lb 9.2 oz)] 55.6 kg (122 lb 9.2 oz) (08/18 0452)   VENTILATOR  SETTINGS: Vent Mode:  [-] CPAP;PSV FiO2 (%):  [30 %-100 %] 30 % Set Rate:  [14 bmp-15 bmp] 14 bmp Vt Set:  [420 mL] 420 mL PEEP:  [5 cmH20] 5 cmH20 Pressure Support:  [5 cmH20] 5 cmH20 Plateau Pressure:  [14 cmH20-17 cmH20] 17 cmH20 : PE General: RASS -2 to -3. Not F/C Neuro:  HEENT: jvd flat PULM: reduced bases CV: s1 s2 SR rrr no r GI: soft, bs hypo, hematoma left large palpable Extremities: BKA right, gen edema  INS/OUTS: Intake/Output     08/17 0701 - 08/18 0700 08/18 0701 - 08/19 0700   I.V. (mL/kg) 1682.5 (30.3) 30 (0.5)   NG/GT 875.8 135   Total Intake(mL/kg) 2558.3 (46) 165 (3)   Urine (mL/kg/hr) 2900 (2.2) 200 (0.8)   Emesis/NG output     Total Output 2900 200   Net -341.8 -35            CBC Recent Labs     09/27/13  0459  09/27/13  1116  09/28/13  0459  WBC  11.7*  13.0*  12.6*  HGB  7.5*  8.0*  7.8*  HCT  21.4*  22.4*  21.8*  PLT  143*  154  162    Coag's Recent Labs     09/26/13  0209  09/26/13  0630  09/27/13  0459  APTT  152*  29  32  INR  1.39  1.54*  1.27    BMET Recent Labs     09/26/13  0330  09/27/13  0459  09/28/13  0459  NA  139  142  145  K  2.7*  3.3*  4.1  CL  109  112  114*  CO2  17*  19  21  BUN  24*  22  21  CREATININE  1.17*  1.02  0.79  GLUCOSE  262*  186*  201*    Electrolytes Recent Labs     09/26/13  0330  09/27/13  0459  09/28/13  0459  CALCIUM  7.2*  7.7*  8.4    Sepsis Markers No results found for this basename: LACTICACIDVEN, PROCALCITON, O2SATVEN,  in the last 72 hours  ABG Recent Labs     09/26/13  0400  PHART  7.412  PCO2ART  30.5*  PO2ART  296.0*    Liver Enzymes Recent Labs     09/25/13  2225  09/26/13  0330  09/27/13  0459  AST  13  7  11   ALT  <5  <5  <5  ALKPHOS  90  60  53  BILITOT  0.2*  <0.2*  0.3  ALBUMIN  3.0*  2.0*  2.4*    Cardiac Enzymes Recent Labs     09/26/13  1210  09/26/13  2158  09/27/13  0459  TROPONINI  0.45*  0.50*  0.45*    Glucose Recent Labs      09/27/13  0749  09/27/13  1124  09/27/13  1633  09/27/13  1933  09/28/13  0350  09/28/13  0805  GLUCAP  186*  221*  161*  140*  182*  223*   CXR:   ASSESSMENT / PLAN:   PULMONARY A:  Acute resp failure due to AMS P:   Extubate 8/18 If fails, reintubate and discuss trach vs terminal wean once rest of family arrives 8/20  CARDIOVASCULAR A:  S/p asystolic arrest secondary to hemorrhagic shock 8/16 CAD Bradycardia, resolved Hypotension, resolved Hypertension, controlled  P:   Cont PRN hydralazine Carvedilol added 8/18 Amlodipine added 8/18  RENAL A:  Acute kidney injury, resolved Hypokalemia, resolved P:   Monitor BMET intermittently Monitor I/Os Correct electrolytes as indicated  GASTROINTESTINAL A: No acute issues P:   Cont SUP and TFs  HEMATOLOGIC A: Acute blood loss, active bleeding resolved Small consumptive coagulopathy, resolved P:   DVT px: not a candidate due to bleeding and AKA Monitor CBC intermittently Transfuse per usual ICU guidelines  INFECTIOUS A: no evidence of infection P:   Follow fever curve  ENDOCRINE A: Reported hx of DM - not on any meds for this PTA Hyperglycemia Possible hyperthyroidism (methimazole on med list) Now hypothyroid (TSH 15 8/16) P:   Cont to hold methimazole Cont sensitive scale SSI Recheck TSH next week  NEUROLOGIC A:  Acute CVA  Acute encephalopathy - possible ischemic injury during arrest  P:   Stroke team managing    TODAY'S SUMMARY:  Discussed with son in detail. He is reluctant to make definitive decisions without other family members present. They will arrive 8/20. We decided to pursue extubation today as she tolerated PS 5 cm H2O all day yest. If she fails extubation will re-intubate and await arrival of other family members to discuss trach vs one-way extubation. If she is not intubated in the interim, when family arrives, will discuss discuss advanced directives accordingly  I have  personally obtained a history, examined the patient, evaluated laboratory and imaging results, formulated the assessment and plan and placed orders.  CRITICAL CARE: The patient is critically ill with multiple organ systems failure and requires high complexity decision making  for assessment and support, frequent evaluation and titration of therapies, application of advanced monitoring technologies and extensive interpretation of multiple databases. Critical Care Time devoted to patient care services described in this note is 40 minutes.   Billy Fischeravid Simonds, MD ; [facility] Mobile [phone].  After 5:30 PM or weekends, call [phone]

## 2013-09-28 NOTE — Progress Notes (Signed)
PT Cancellation Note  Patient Details Name: Lianne MorisKatie Huseby MRN: [id number] DOB: 25-Jan-1925   Cancelled Treatment:    Reason Eval/Treat Not Completed: Patient not medically ready   Fabio AsaWerner, Lacheryl Niesen J 09/28/2013, 11:18 AM Charlotte Crumbevon Nuriya Stuck, PT DPT  [phone]

## 2013-09-28 NOTE — Consult Note (Signed)
Patient ZO:XWRUE:Victoria West      DOB: December 02, 1924      AVW:[id number]     Consult Note from the Palliative Medicine Team at [facility]    Consult Requested by: Dr. 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  Patient's Son French Anaracy and Daughter-in-law Suan HalterCharmaine report that she kept house up until his dad died.  She cared for her spouse at home under hospice care (cancer diagnosis) which is the connection for this family to hospice.  French Anaracy states his mom and dad did not have advanced directives but that they told each other they would never put each other in a nursing home, and they only made choices for end of life care at the last minute.  French Anaracy felt that when he had to place his mother at [facility] that he might be violating her wishes which bothered him.  He realized though that he had no choice.  French Anaracy further shared that his mother stopped caring for herself after his dad died and the housework suffered which was his first indication that something was wrong.  She has lived at [facility] for the last 5 yrs.  Initially she was interactive playing Bingo etc, recently she has spoken about being more forgetful and that she was worried that she had Alzheimer's.  She mainly kept busy rearranging her room and helping to fold towels.  Family felt that this still represented reasonable life quality even though they report her to have been a very active woman. We reviewed the pictures from the MRI showing extensive involvement from the stroke and described the potential impact on overall wakefulness, speech, vision and potential general execution issues.  French Anaracy was not able to make different choices for his mom in the moment. At this time ,  he request to continue to provide positive and negative information that he can insert into his framework for decision making. I encouraged him to draw on the opinions of his siblings even though he does not see them as being responsible ,involved or stepping up to the plate.  He describes his family as "not as close as his mother would want them to be".    Goals of Care: 1.  Code Status: Full Code. French Anaracy is having a hard time going what he considers "against" his mother's wishes.  We talked honestly about reconsidering and reframing this choice in that she could not at this time process how this stroke would change her life.  We talked extensively about preplanning so as to not put her in a position to be harmed.  French Anaracy will think over this as he get new information and consider a change. We talked about the potential for trach and peg use.   2. Scope of Treatment: For now family requesting full code.  French Anaracy wants to talk directly with pulmonary and neurology about the imminent and future outlook before making any further decisions. He is a wise and sensitive man and needs facts to round out what is a very emotional decision for him.   4. Disposition: To be determined.  French Anaracy has already checked with [facility] regarding availability of Palliative Care and Hospice . We will cross that bridge if we get to it.  Patient is at high risk  for not leaving the hospital depending on how family decides to go.  French Ana is concerned about jumping to decisions and conclusions too quickly.   3. Symptom Management:  Per CCM and Primary service for now.  4. 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 Spiritual: Patient has not been to her church in 5 yrs.  Due to being at [facility].  She did attend the catholic services their because of location but is Protestant.  French Ana was going to call and see if her old church would visit.  I offered chaplain support.        Patient Documents Completed  or Given: Document Given Completed  Advanced Directives Pkt    MOST    DNR    Gone from My Sight    Hard Choices      Brief HPI: 80 yr old african [place] female found with aphasia and right sided weakness.  She has suffered a Left sided  MCA stroke.  Patient was not TPA eligible but did have IR thrombectomy.  Course was complicated by hemorrhagic shock with cardiac arrest s/p resuscitation. We were asked to assist with goals of care.   ROS: unable to be assessed due to intubation with altered mentation.    PMH: History reviewed. No pertinent past medical history.   PSH: Past Surgical History  Procedure Laterality Date  . Radiology with anesthesia N/A 09/25/2013    Procedure: RADIOLOGY WITH ANESTHESIA;  Surgeon: Oneal Grout, MD;  Location: [facility] OR;  Service: Radiology;  Laterality: N/A;   I have reviewed the FH and SH and  If appropriate update it with new information. No Known Allergies Scheduled Meds: . amLODipine  10 mg Oral Daily  . antiseptic oral rinse  7 mL Mouth Rinse QID  . carvedilol  3.125 mg Oral BID WC  . chlorhexidine  15 mL Mouth Rinse BID  . feeding supplement (VITAL HIGH PROTEIN)  1,000 mL Per Tube Q24H  . insulin aspart  0-9 Units Subcutaneous 6 times per day  . pantoprazole sodium  40 mg Per Tube Q1200   Continuous Infusions: . sodium chloride 10 mL/hr at 09/28/13 1000  . niCARDipine Stopped (09/28/13 0557)   PRN Meds:.acetaminophen, acetaminophen, albuterol, fentaNYL, hydrALAZINE, metoprolol, midazolam, ondansetron (ZOFRAN) IV    BP 197/81  Pulse 80  Temp(Src) 98.9 F (37.2 C) (Oral)  Resp 25  Ht 5\' 4"  (1.626 m)  Wt 55.6 kg (122 lb 9.2 oz)  BMI 21.03 kg/m2  SpO2 100%   PPS: can't be fully assessed due to intubation   Intake/Output Summary (Last 24 hours) at 09/28/13 1015 Last data filed at 09/28/13 1000  Gross per 24 hour  Intake 2198.25 ml  Output   3100 ml  Net -901.75 ml   LBM: 09/26/13                 Physical  Exam:  General:  Will frown and grimace when stimulated, can squeeze hand to command per family  HEENT: Unable to fully assess, vented and resists exam, ETT, Feeding tube Chest:   Decreased but clear CVS: regular, s1, s2 Abdomen:soft, not disteded Ext:  Right AKA healed stump, left lower extremity hyperpigmented cool, 1+-2 pulse Neuro:agitates easily but no fully wakeful  Labs: CBC    Component Value Date/Time   WBC 12.6* 09/28/2013 0459   RBC 2.45* 09/28/2013 0459   HGB 7.8* 09/28/2013 0459   HCT 21.8* 09/28/2013 0459   PLT 162 09/28/2013 0459  MCV 89.0 09/28/2013 0459   MCH 31.8 09/28/2013 0459   MCHC 35.8 09/28/2013 0459   RDW 16.6* 09/28/2013 0459   LYMPHSABS 1.0 09/27/2013 0459   MONOABS 0.6 09/27/2013 0459   EOSABS 0.0 09/27/2013 0459   BASOSABS 0.0 09/27/2013 0459     CMP     Component Value Date/Time   NA 145 09/28/2013 0459   K 4.1 09/28/2013 0459   CL 114* 09/28/2013 0459   CO2 21 09/28/2013 0459   GLUCOSE 201* 09/28/2013 0459   BUN 21 09/28/2013 0459   CREATININE 0.79 09/28/2013 0459   CALCIUM 8.4 09/28/2013 0459   PROT 4.6* 09/27/2013 0459   ALBUMIN 2.4* 09/27/2013 0459   AST 11 09/27/2013 0459   ALT <5 09/27/2013 0459   ALKPHOS 53 09/27/2013 0459   BILITOT 0.3 09/27/2013 0459   GFRNONAA 73* 09/28/2013 0459   GFRAA 84* 09/28/2013 0459  Abd Xray: Interval advancement of enteric tube with tip and side-port now  projecting over the gastric fundus.    Chest Xray Reviewed/Impressions: Endotracheal tube tip is 3.6 cm above the carina. Central catheter  tip is in the superior vena cava. Nasogastric tube tip and side port  are in the stomach region. No pneumothorax. There is atelectatic  change in the left base. Elsewhere lungs are clear. Heart is mildly  enlarged with pulmonary vascularity within normal limits. No  adenopathy.   CT scan of the Head Reviewed/Impressions:  No acute intracranial abnormality. Atrophy, chronic small vessel  disease.   MRI brain: Acute infarct  in the posterior portion of the left MCA territory.  Small area of acute infarct in the right medial occipital lobe.  Severe stenosis distal left M1 segment with occlusion of the  posterior segment of the left middle cerebral artery corresponding  to acute infarct. Decreased flow in the anterior division of the  left middle cerebral artery.    Time In Time Out Total Time Spent with Patient Total Overall Time  905 am 1025  am 15 min 80 min    Greater than 50%  of this time was spent counseling and coordinating care related to the above assessment and plan.  Nataliah Hatlestad L. Ladona Ridgel, MD MBA The Palliative Medicine Team at [facility] Phone: [phone] Pager: [phone] ( Use team phone after hours)

## 2013-09-28 NOTE — Progress Notes (Signed)
OT Cancellation Note  Patient Details Name: Lianne MorisKatie Carano MRN: [id number] DOB: [birth year]   Cancelled Treatment:    Reason Eval/Treat Not Completed: Other (comment) Awaiting arrival of family to determine plan of care. Will sign off at this time and reassess after family arrives to determine appropriateness of therapy. [facility],HILLARY Eaden Hettinger, OTR/L  [phone] 09/28/2013 09/28/2013, 11:34 AM

## 2013-09-28 NOTE — Plan of Care (Signed)
Problem: Acute Treatment Outcomes Goal: Prognosis discussed with family/patient as appropriate Outcome: Completed/Met Date Met:  09/28/13 Dr Lovena Le and Dr Leonidas Romberg met with son Olivia Mackie today.

## 2013-09-28 NOTE — Procedures (Signed)
Extubation Procedure Note  Patient Details:   Name: Lianne MorisKatie Laplante DOB: 1924-11-15 MRN: [id number]   Airway Documentation:  Airway 7.5 mm (Active)  Secured at (cm) 23 cm 09/28/2013  8:17 AM  Measured From Lips 09/28/2013  8:17 AM  Secured Location Center 09/28/2013  8:17 AM  Secured By [employer] Tube Holder 09/28/2013  8:17 AM  Tube Holder Repositioned Yes 09/28/2013  8:17 AM  Cuff Pressure (cm H2O) 25 cm H2O 09/26/2013  7:13 PM  Site Condition Dry 09/28/2013  4:10 AM    Evaluation  O2 sats: stable throughout Complications: No apparent complications Patient did tolerate procedure well. Bilateral Breath Sounds: Clear Suctioning: Airway No Pt extubated per MD, pt placed on 4lpm [place] sp02 100%  Melanee Spryelson, Calyse Murcia Lawson 09/28/2013, 11:31 AM

## 2013-09-28 NOTE — Progress Notes (Signed)
Inpatient Diabetes Program Recommendations  AACE/ADA: New Consensus Statement on Inpatient Glycemic Control (2013)  Target Ranges:  Prepandial:   less than 140 mg/dL      Peak postprandial:   less than 180 mg/dL (1-2 hours)      Critically ill patients:  140 - 180 mg/dL   Reason for Visit: Hyperglycemia  Diabetes history: DM2 Outpatient Diabetes medications: None Current orders for Inpatient glycemic control: Novolog sensitive Q4H  Results for Victoria West, Victoria West (MRN [id number]) as of 09/28/2013 09:31  Ref. Range 09/28/2013 03:50 09/28/2013 08:05  Glucose-Capillary Latest Range: 70-99 mg/dL 409182 (H) 811223 (H)    Inpatient Diabetes Program Recommendations Insulin - Meal Coverage: May benefit from TF coverage if CBGs consistently >180 mg/dL. Novolog 2 units Q4H. HgbA1C: 6.5% - good glycemic control  Note: Will continue to follow. Thank you. Ailene Ardshonda Domnic Vantol, RD, LDN, CDE Inpatient Diabetes Coordinator [phone]

## 2013-09-28 NOTE — Clinical Social Work Note (Signed)
Clinical Social Work Department BRIEF PSYCHOSOCIAL ASSESSMENT 09/28/2013  Patient:  Victoria West, Victoria West     Account Number:  [id number]     Admit date:  09/25/2013  Clinical Social Worker:  Myles Lipps  Date/Time:  09/28/2013 04:30 PM  Referred by:  RN  Date Referred:  09/28/2013 Referred for  SNF Placement   Other Referral:   Interview type:  Family Other interview type:   Patient son, Olivia Mackie at bedside    PSYCHOSOCIAL DATA Living Status:  FACILITY Admitted from facility:  [facility] Level of care:  Clay Primary support name:  Jerri, Glauser  [phone] Primary support relationship to patient:  CHILD, ADULT Degree of support available:   Strong    CURRENT CONCERNS Current Concerns  Post-Acute Placement  Adjustment to Illness   Other Concerns:    SOCIAL WORK ASSESSMENT / PLAN Clinical Social Worker met with patient son at bedside to offer support and discuss patient needs at discharge. Patient son states that patient is a current resident at [facility] and they are beyond satisfied with the care that patient receives.  Patient son is hopeful that patient will progress enough to return to [facility] at discharge.  CSW has completed FL2 and will send updated clinical information to [facility] per patient son request.  Patient son states that patient additional family members will be in town from [place] on Thursday to assist with decision making.  CSW remains available for support and to facilitate patient discharge needs once medically stable.   Assessment/plan status:  Psychosocial Support/Ongoing Assessment of Needs Other assessment/ plan:   Information/referral to community resources:   Clinical Social Worker to provide patient existing facility with updated clinical information.  Patient son would like for patient to return no matter what her care needs may be - aggressive vs. comfort.    PATIENT'S/FAMILY'S RESPONSE TO PLAN OF CARE: Patient  remains off the ventilator, however only responds to pain at this time.  Patient son very supportive at bedside and understanding of patient current condition. Patient son is overwhelmed with patient decline and need for decision making but seems realistic regarding patient long term needs.  Patient son states that patient family is coming from [place] and will be a good support system to him as well.  Patient son verbalized understanding of CSW role and appreciation for support and involvement.

## 2013-09-28 NOTE — Progress Notes (Signed)
*  PRELIMINARY RESULTS* Vascular Ultrasound Carotid Duplex (Doppler) has been completed.  Preliminary findings: Bilateral:  1-39% ICA stenosis.  Vertebral artery flow is antegrade.      Farrel DemarkJill Eunice, RDMS, RVT  09/28/2013, 10:45 AM

## 2013-09-28 NOTE — Progress Notes (Signed)
Stroke Team Progress Note  HISTORY Victoria West is an 80 y.o. female with a history of hypertension, hypothyroidism, glaucoma and dementia who was brought to the emergency room after being found unable to speak as well as unable to move her right side and gaze to the left side. There is no previous history of stroke. CT scan of her head showed no acute intracranial abnormality. She was last seen well at 1700 today. She was beyond time window for intervention with TPA when she arrived in the emergency room. NIH stroke score was 24.   LSN: 1700 on 09/25/2013  tPA Given: No: Beyond time under for treatment consideration  MRankin: 4  S/P Left common carotid arteriogram,followed by mechanical thrombolysis of occluded inf division of LT MCA using a microguidewire - Dr Corliss Skainseveshwar 09/26/2013 1:11 AM  SUBJECTIVE Still intubated. Not sedated. Eyes closed. Not following commands. Son was at bedside during round. MRI done yesterday and confirmed left large MCA stroke with M2 cut off. likely extubated today. Palliative care consult placed.   OBJECTIVE Most recent Vital Signs: Filed Vitals:   09/28/13 0452 09/28/13 0500 09/28/13 0600 09/28/13 0700  BP:  162/62 177/65 168/71  Pulse:  78 86 86  Temp:      TempSrc:      Resp:  17 20 18   Height:      Weight: 122 lb 9.2 oz (55.6 kg)     SpO2:  98% 100% 100%   CBG (last 3)   Recent Labs  09/27/13 1124 09/27/13 1633 09/27/13 1933  GLUCAP 221* 161* 140*    IV Fluid Intake:   . sodium chloride 10 mL/hr at 09/28/13 0700  . niCARDipine Stopped (09/28/13 0557)    MEDICATIONS  . amLODipine  10 mg Oral Daily  . antiseptic oral rinse  7 mL Mouth Rinse QID  . carvedilol  3.125 mg Oral BID WC  . chlorhexidine  15 mL Mouth Rinse BID  . feeding supplement (VITAL HIGH PROTEIN)  1,000 mL Per Tube Q24H  . insulin aspart  0-9 Units Subcutaneous 6 times per day  . pantoprazole sodium  40 mg Per Tube Q1200   PRN:  acetaminophen, acetaminophen, albuterol,  fentaNYL, hydrALAZINE, metoprolol, midazolam, ondansetron (ZOFRAN) IV  Diet:  NPO no liquids Activity:  Bedrest DVT Prophylaxis:  Currently on no DVT prophylaxis secondary to large left groin hematoma. And R LE BKA.  CLINICALLY SIGNIFICANT STUDIES Basic Metabolic Panel:   Recent Labs Lab 09/27/13 0459 09/28/13 0459  NA 142 145  K 3.3* 4.1  CL 112 114*  CO2 19 21  GLUCOSE 186* 201*  BUN 22 21  CREATININE 1.02 0.79  CALCIUM 7.7* 8.4   Liver Function Tests:   Recent Labs Lab 09/26/13 0330 09/27/13 0459  AST 7 11  ALT <5 <5  ALKPHOS 60 53  BILITOT <0.2* 0.3  PROT 4.1* 4.6*  ALBUMIN 2.0* 2.4*   CBC:  Recent Labs Lab 09/26/13 0209  09/27/13 0459 09/27/13 1116 09/28/13 0459  WBC 3.3*  < > 11.7* 13.0* 12.6*  NEUTROABS 2.2  --  10.1*  --   --   HGB 7.7*  < > 7.5* 8.0* 7.8*  HCT 24.0*  < > 21.4* 22.4* 21.8*  MCV 100.4*  < > 89.5 90.0 89.0  PLT 241  < > 143* 154 162  < > = values in this interval not displayed. Coagulation:   Recent Labs Lab 09/25/13 2225 09/26/13 0209 09/26/13 0630 09/27/13 0459  LABPROT 13.6 17.1* 18.5* 15.9*  INR 1.04 1.39 1.54* 1.27   Cardiac Enzymes:   Recent Labs Lab 09/26/13 0330 09/26/13 1210 09/26/13 2158 09/27/13 0459  CKTOTAL 46 97  --   --   CKMB  --  4.9*  --   --   TROPONINI  --  0.45* 0.50* 0.45*   Urinalysis: No results found for this basename: COLORURINE, APPERANCEUR, LABSPEC, PHURINE, GLUCOSEU, HGBUR, BILIRUBINUR, KETONESUR, PROTEINUR, UROBILINOGEN, NITRITE, LEUKOCYTESUR,  in the last 168 hours Lipid Panel    Component Value Date/Time   CHOL 136 09/26/2013 0330   TRIG 102 09/26/2013 0330   HDL 26* 09/26/2013 0330   CHOLHDL 5.2 09/26/2013 0330   VLDL 20 09/26/2013 0330   LDLCALC 90 09/26/2013 0330   HgbA1C  Lab Results  Component Value Date   HGBA1C 6.5* 09/26/2013    Urine Drug Screen:   No results found for this basename: labopia,  cocainscrnur,  labbenz,  amphetmu,  thcu,  labbarb    Alcohol Level: No  results found for this basename: ETH,  in the last 168 hours  Ct Head Wo Contrast 09/26/2013    No acute intracranial abnormality. Atrophy, chronic small vessel disease.     Ct Head (brain) Wo Contrast 09/25/2013    1. No acute intracranial pathology seen on CT.  2. Mild to moderate cortical volume loss and diffuse small vessel ischemic microangiopathy.    Dg Chest Port 1 View 09/26/2013    1. Endotracheal tube seen ending 3-4 cm above the carina.  2. Left subclavian line seen ending about the distal SVC.  3. Enteric tube noted extending below the diaphragm, though the side port is seen ending about the distal esophagus.  4. No acute cardiopulmonary process seen; borderline cardiomegaly noted.     MRI of the brain  IMPRESSION:  Acute infarct in the posterior portion of the left MCA territory.  Small area of acute infarct in the right medial occipital lobe.  Severe stenosis distal left M1 segment with occlusion of the  posterior segment of the left middle cerebral artery corresponding  to acute infarct. Decreased flow in the anterior division of the  left middle cerebral artery.  Carotid Doppler  Bilateral: 1-39% ICA stenosis. Vertebral artery flow is antegrade.  2D Echocardiogram  - LVEF 70-75%, severe concentric LVH, trileaflet calcified aortic valve with calcified mass on non-coronary cusp and calcification of the tip of the left coronary cusp, no significant stenosis, trivial AI, moderate TR, RVSP 53 (moderate pulmonary hypertension), diastolic dysfunction with indeterminate, but likely elevated LV Filling pressure.  Lower extremity arterial Doppler ( left ) - pending  EKG  Sinus or ectopic atrial rhythm rate 55 beats per minute.  For complete results please see formal report.   Therapy Recommendations pending    Physical exam  Temp:  [98 F (36.7 C)-99.9 F (37.7 C)] 99 F (37.2 C) (08/18 0351) Pulse Rate:  [50-115] 86 (08/18 0700) Resp:  [10-27] 18 (08/18 0700) BP:  (128-204)/(49-78) 168/71 mmHg (08/18 0700) SpO2:  [94 %-100 %] 100 % (08/18 0700) Arterial Line BP: (93-177)/(51-144) 93/78 mmHg (08/17 1400) FiO2 (%):  [30 %-100 %] 30 % (08/18 0600) Weight:  [122 lb 9.2 oz (55.6 kg)] 122 lb 9.2 oz (55.6 kg) (08/18 0452)  General - thin, well developed, intubated.  Ophthalmologic - not cooperative on exam.  Cardiovascular - Regular rate and rhythm with no murmur.  Extremities - right LE BKA, Left LE wound at left shin and left groin hematoma much improved.  NEUROLOGIC:  MENTAL STATUS: Intubated. Eyes half open, did not follows simple commands. CRANIAL NERVES: pupils sluggish, does not blink to threat on the right, eyes are in the middle position. MOTOR: No movement of the right upper extremity, 1/5 on pain stimulation. RLE BKA, but 2/5 on pain stimulation. Spontaneous movement on the left UE, 2/5 LLE on pain stimulation. SENSORY: seems to respond to pain stimulation bilaterally COORDINATION: Unable to test  ASSESSMENT Victoria West is a 80 y.o. female presenting with aphasia and right hemiparesis . TPA was not administered secondary to late presentation. Status post left common carotid arteriogram, followed by mechanical thrombolysis of occluded inf division of LT MCA using a microguidewire - Dr Corliss Skains 09/26/2013 1:11 AM. Head CT revealed no acute findings. MRI confirmed left MCA stroke and M2 cut off. Likely extubate today. On aspirin 81 mg orally every day prior to admission. Now on no antithrombotics or anticoagulation for secondary stroke prevention secondary to left groin hematoma. Patient with resultant right hemiplegia.    Hyperlipidemia  - Cholesterol 136; LDL 90 - Pravachol prior to admission, resume after po assess  Hypertension history  Dementia  Intubated on ventilator  Diabetes mellitus - hemoglobin A1c 6.5  Renal insufficiency  Low calcium level - 7.2  Right BKA  PLAN Stroke - MRI confirmed left MCA stroke with M2 cut  off.  - likely extubate today. If pt not able to protect airway, will re-intubate as per current plan.  - BP stable and currently off cardene  - Hematoma and left femoral artery puncture site, requiring transfusion - currently not on ASA or DVT prophylaxis due to bleeding concerns. CBC stable today. Will keep monitoring - will add ASA 325 mg orally every day and heparin for DVT prophylaxis once hematoma stable, likely tomorrow (72 hours after active bleeding). - SCDs not ordered secondary to right BKA and no palpable pulse left lower extremity. - Anemia - stable, today Hb 7.8 from yesterday 8.0 - Hypokalemia - resolved, now 4.1 - HR stable today.  - pending eft lower extremity Dopplers - palliative care on board - had long discussion with son regarding her diagnosis, treatment plan and prognosis. He is waiting for his brother came from [place] to make joint decision. Currently pt full code.  This patient is critically ill due to left MCA stroke and respiratory failure with numerous comorbidities and at significant risk of neurological worsening, death form cerebral edema, respiratory failure, anemia, bleeding, and infection. This patient's care requires constant monitoring of vital signs, hemodynamics, respiratory and cardiac monitoring, review of multiple databases, neurological assessment, discussion with family, other specialists and medical decision making of high complexity. I spent 35 minutes of neurocritical care time in the care of this patient.  Marvel Plan, MD PhD Stroke Neurology 09/28/2013 7:45 AM    To contact Stroke Continuity provider, please refer to [email or web address]. After hours, contact General Neurology

## 2013-09-29 ENCOUNTER — Inpatient Hospital Stay (HOSPITAL_COMMUNITY): Payer: Medicare Other

## 2013-09-29 LAB — BASIC METABOLIC PANEL
Anion gap: 14 (ref 5–15)
BUN: 25 mg/dL — ABNORMAL HIGH (ref 6–23)
CALCIUM: 8.7 mg/dL (ref 8.4–10.5)
CO2: 23 mEq/L (ref 19–32)
CREATININE: 0.69 mg/dL (ref 0.50–1.10)
Chloride: 107 mEq/L (ref 96–112)
GFR calc Af Amer: 88 mL/min — ABNORMAL LOW (ref >90)
GFR calc non Af Amer: 76 mL/min — ABNORMAL LOW (ref >90)
GLUCOSE: 222 mg/dL — AB (ref 70–99)
Potassium: 3.8 mEq/L (ref 3.7–5.3)
Sodium: 144 mEq/L (ref 137–147)

## 2013-09-29 LAB — BLOOD GAS, ARTERIAL
Acid-base deficit: 1.2 mmol/L (ref 0.0–2.0)
BICARBONATE: 22.6 meq/L (ref 20.0–24.0)
Drawn by: 312761
O2 Content: 4 L/min
O2 Saturation: 99.8 %
PATIENT TEMPERATURE: 98.6
PH ART: 7.424 (ref 7.350–7.450)
TCO2: 23.7 mmol/L (ref 0–100)
pCO2 arterial: 35.1 mmHg (ref 35.0–45.0)
pO2, Arterial: 193 mmHg — ABNORMAL HIGH (ref 80.0–100.0)

## 2013-09-29 LAB — CBC
HEMATOCRIT: 24.9 % — AB (ref 36.0–46.0)
HEMOGLOBIN: 8.4 g/dL — AB (ref 12.0–15.0)
MCH: 30.5 pg (ref 26.0–34.0)
MCHC: 33.7 g/dL (ref 30.0–36.0)
MCV: 90.5 fL (ref 78.0–100.0)
Platelets: 200 10*3/uL (ref 150–400)
RBC: 2.75 MIL/uL — ABNORMAL LOW (ref 3.87–5.11)
RDW: 16.6 % — AB (ref 11.5–15.5)
WBC: 12.4 10*3/uL — ABNORMAL HIGH (ref 4.0–10.5)

## 2013-09-29 LAB — GLUCOSE, CAPILLARY
GLUCOSE-CAPILLARY: 130 mg/dL — AB (ref 70–99)
Glucose-Capillary: 194 mg/dL — ABNORMAL HIGH (ref 70–99)
Glucose-Capillary: 228 mg/dL — ABNORMAL HIGH (ref 70–99)
Glucose-Capillary: 207 mg/dL — ABNORMAL HIGH (ref 70–99)
Glucose-Capillary: 155 mg/dL — ABNORMAL HIGH (ref 70–99)

## 2013-09-29 MED ORDER — ACETAMINOPHEN 160 MG/5ML PO SOLN
650.0000 mg | ORAL | Status: DC | PRN
  Administered 2013-09-29 – 2013-10-09 (×7): 650 mg
  Filled 2013-09-29 (×7): qty 20.3

## 2013-09-29 MED ORDER — METOPROLOL TARTRATE 1 MG/ML IV SOLN
2.5000 mg | INTRAVENOUS | Status: DC | PRN
  Administered 2013-10-02: 5 mg via INTRAVENOUS
  Filled 2013-09-29: qty 5

## 2013-09-29 MED ORDER — INSULIN ASPART 100 UNIT/ML ~~LOC~~ SOLN
0.0000 [IU] | SUBCUTANEOUS | Status: DC
  Administered 2013-09-29: 3 [IU] via SUBCUTANEOUS
  Administered 2013-09-29: 2 [IU] via SUBCUTANEOUS
  Administered 2013-09-29 (×2): 5 [IU] via SUBCUTANEOUS
  Administered 2013-09-30 (×5): 3 [IU] via SUBCUTANEOUS
  Administered 2013-10-01: 2 [IU] via SUBCUTANEOUS
  Administered 2013-10-01 (×3): 3 [IU] via SUBCUTANEOUS
  Administered 2013-10-01 (×2): 2 [IU] via SUBCUTANEOUS
  Administered 2013-10-02 (×6): 3 [IU] via SUBCUTANEOUS
  Administered 2013-10-03: 2 [IU] via SUBCUTANEOUS
  Administered 2013-10-03 (×2): 3 [IU] via SUBCUTANEOUS
  Administered 2013-10-03: 2 [IU] via SUBCUTANEOUS
  Administered 2013-10-03 (×2): 3 [IU] via SUBCUTANEOUS
  Administered 2013-10-04: 2 [IU] via SUBCUTANEOUS
  Administered 2013-10-04 (×2): 3 [IU] via SUBCUTANEOUS
  Administered 2013-10-04 (×2): 2 [IU] via SUBCUTANEOUS
  Administered 2013-10-04 – 2013-10-05 (×3): 3 [IU] via SUBCUTANEOUS
  Administered 2013-10-05: 2 [IU] via SUBCUTANEOUS
  Administered 2013-10-05 – 2013-10-06 (×3): 3 [IU] via SUBCUTANEOUS
  Administered 2013-10-06: 2 [IU] via SUBCUTANEOUS
  Administered 2013-10-06 (×3): 3 [IU] via SUBCUTANEOUS
  Administered 2013-10-09 (×2): 2 [IU] via SUBCUTANEOUS
  Administered 2013-10-09 – 2013-10-10 (×2): 3 [IU] via SUBCUTANEOUS
  Administered 2013-10-10: 2 [IU] via SUBCUTANEOUS
  Administered 2013-10-10: 3 [IU] via SUBCUTANEOUS
  Administered 2013-10-10 – 2013-10-11 (×2): 2 [IU] via SUBCUTANEOUS
  Administered 2013-10-11: 3 [IU] via SUBCUTANEOUS
  Administered 2013-10-11: 2 [IU] via SUBCUTANEOUS

## 2013-09-29 MED ORDER — PANTOPRAZOLE SODIUM 40 MG PO PACK
40.0000 mg | PACK | Freq: Every day | ORAL | Status: DC
  Administered 2013-09-30: 40 mg
  Filled 2013-09-29 (×3): qty 20

## 2013-09-29 MED ORDER — NICARDIPINE HCL IN NACL 20-0.86 MG/200ML-% IV SOLN
5.0000 mg/h | INTRAVENOUS | Status: DC
Start: 2013-09-29 — End: 2013-10-05
  Administered 2013-09-29: 3 mg/h via INTRAVENOUS
  Administered 2013-09-29: 4 mg/h via INTRAVENOUS
  Administered 2013-09-29 (×3): 5 mg/h via INTRAVENOUS
  Administered 2013-09-30 (×3): 3 mg/h via INTRAVENOUS
  Administered 2013-10-02 – 2013-10-04 (×3): 5 mg/h via INTRAVENOUS
  Filled 2013-09-29 (×10): qty 200

## 2013-09-29 MED ORDER — LISINOPRIL 20 MG PO TABS
20.0000 mg | ORAL_TABLET | Freq: Every day | ORAL | Status: DC
  Filled 2013-09-29: qty 1

## 2013-09-29 MED ORDER — HEPARIN SODIUM (PORCINE) 5000 UNIT/ML IJ SOLN
5000.0000 [IU] | Freq: Three times a day (TID) | INTRAMUSCULAR | Status: DC
  Administered 2013-09-29 – 2013-10-01 (×7): 5000 [IU] via SUBCUTANEOUS
  Filled 2013-09-29 (×10): qty 1

## 2013-09-29 MED ORDER — ASPIRIN 325 MG PO TABS: 325.0000 mg | ORAL_TABLET | Freq: Every day | ORAL | Status: DC

## 2013-09-29 MED ORDER — FENTANYL CITRATE 0.05 MG/ML IJ SOLN
25.0000 ug | Freq: Once | INTRAMUSCULAR | Status: AC
  Administered 2013-09-29: 25 ug via INTRAVENOUS
  Filled 2013-09-29: qty 2

## 2013-09-29 MED ORDER — LISINOPRIL 20 MG PO TABS
20.0000 mg | ORAL_TABLET | Freq: Every day | ORAL | Status: DC
  Administered 2013-09-29 – 2013-10-01 (×3): 20 mg
  Filled 2013-09-29 (×4): qty 1

## 2013-09-29 MED ORDER — PANTOPRAZOLE SODIUM 40 MG IV SOLR
40.0000 mg | INTRAVENOUS | Status: DC
  Administered 2013-09-29: 40 mg via INTRAVENOUS
  Filled 2013-09-29: qty 40

## 2013-09-29 MED ORDER — ASPIRIN 325 MG PO TABS
325.0000 mg | ORAL_TABLET | Freq: Every day | ORAL | Status: DC
  Administered 2013-09-29: 325 mg
  Filled 2013-09-29 (×2): qty 1

## 2013-09-29 MED ORDER — CARVEDILOL 6.25 MG PO TABS
6.2500 mg | ORAL_TABLET | Freq: Two times a day (BID) | ORAL | Status: DC
  Administered 2013-09-29 – 2013-10-11 (×21): 6.25 mg
  Filled 2013-09-29 (×25): qty 1

## 2013-09-29 NOTE — Progress Notes (Signed)
PT Cancellation Note  Patient Details Name: Lianne MorisKatie Biven MRN: [id number] DOB: 1924-09-20   Cancelled Treatment:    Reason Eval/Treat Not Completed: Patient not medically ready (sign off, please re consult when medically appropriate)   Fabio AsaWerner, Rasheena Talmadge J 09/29/2013, 10:11 AM Charlotte Crumbevon Claudie Brickhouse, PT DPT  [phone]

## 2013-09-29 NOTE — Progress Notes (Signed)
Stroke Team Progress Note  HISTORY Victoria West is an 80 y.o. female with a history of hypertension, hypothyroidism, glaucoma and dementia who was brought to the emergency room after being found unable to speak as well as unable to move her right side and gaze to the left side. There is no previous history of stroke. CT scan of her head showed no acute intracranial abnormality. She was last seen well at 1700 today. She was beyond time window for intervention with TPA when she arrived in the emergency room. NIH stroke score was 24.   LSN: [id number] on 09/25/2013  tPA Given: No: Beyond time under for treatment consideration  MRankin: 4  S/P Left common carotid arteriogram,followed by mechanical thrombolysis of occluded inf division of LT MCA using a microguidewire - Dr Corliss Skains 09/26/2013 1:11 AM  SUBJECTIVE No family member in room during round. She was extubated yesterday. Overnight, she developed tachycardia, tachypnea, and hypertension. She was put on cardene drip. During rounds, her BP was under control and O2 sat stable. ABG done and largely WNL. Will continue to monitor the need of re-intubation. Stat CXR ordered.  OBJECTIVE Most recent Vital Signs: Filed Vitals:   09/29/13 0900 09/29/13 1000 09/29/13 1100 09/29/13 1200  BP: 168/84 175/69 157/66 126/79  Pulse: 102 107 104 90  Temp:      TempSrc:      Resp: 24 19 25 24   Height:      Weight:      SpO2: 100% 100% 100% 100%   CBG (last 3)   Recent Labs  09/29/13 0321 09/29/13 0805 09/29/13 1257  GLUCAP 194* 228* 207*    IV Fluid Intake:   . sodium chloride 10 mL/hr at 09/29/13 1100  . niCARDipine 5 mg/hr (09/29/13 1220)    MEDICATIONS  . amLODipine  10 mg Per Tube Daily  . antiseptic oral rinse  7 mL Mouth Rinse QID  . aspirin  325 mg Per Tube Daily  . carvedilol  6.25 mg Per Tube BID WC  . chlorhexidine  15 mL Mouth Rinse BID  . feeding supplement (VITAL HIGH PROTEIN)  1,000 mL Per Tube Q24H  . free water  100 mL Per Tube  3 times per day  . heparin subcutaneous  5,000 Units Subcutaneous 3 times per day  . insulin aspart  0-15 Units Subcutaneous 6 times per day  . lisinopril  20 mg Per Tube Daily  . pantoprazole sodium  40 mg Per Tube Q1200   PRN:  acetaminophen (TYLENOL) oral liquid 160 mg/5 mL, albuterol, hydrALAZINE, metoprolol, ondansetron (ZOFRAN) IV  Diet:    no liquids Activity:  Bedrest DVT Prophylaxis:  Heparin subq.   CLINICALLY SIGNIFICANT STUDIES Basic Metabolic Panel:   Recent Labs Lab 09/28/13 0459 09/29/13 1020  NA 145 144  K 4.1 3.8  CL 114* 107  CO2 21 23  GLUCOSE 201* 222*  BUN 21 25*  CREATININE 0.79 0.69  CALCIUM 8.4 8.7   Liver Function Tests:   Recent Labs Lab 09/26/13 0330 09/27/13 0459  AST 7 11  ALT <5 <5  ALKPHOS 60 53  BILITOT <0.2* 0.3  PROT 4.1* 4.6*  ALBUMIN 2.0* 2.4*   CBC:  Recent Labs Lab 09/26/13 0209  09/27/13 0459  09/28/13 0459 09/29/13 1020  WBC 3.3*  < > 11.7*  < > 12.6* 12.4*  NEUTROABS 2.2  --  10.1*  --   --   --   HGB 7.7*  < > 7.5*  < > 7.8*  8.4*  HCT 24.0*  < > 21.4*  < > 21.8* 24.9*  MCV 100.4*  < > 89.5  < > 89.0 90.5  PLT 241  < > 143*  < > 162 200  < > = values in this interval not displayed. Coagulation:   Recent Labs Lab 09/25/13 2225 09/26/13 0209 09/26/13 0630 09/27/13 0459  LABPROT 13.6 17.1* 18.5* 15.9*  INR 1.04 1.39 1.54* 1.27   Cardiac Enzymes:   Recent Labs Lab 09/26/13 0330 09/26/13 1210 09/26/13 2158 09/27/13 0459  CKTOTAL 46 97  --   --   CKMB  --  4.9*  --   --   TROPONINI  --  0.45* 0.50* 0.45*   Urinalysis: No results found for this basename: COLORURINE, APPERANCEUR, LABSPEC, PHURINE, GLUCOSEU, HGBUR, BILIRUBINUR, KETONESUR, PROTEINUR, UROBILINOGEN, NITRITE, LEUKOCYTESUR,  in the last 168 hours Lipid Panel    Component Value Date/Time   CHOL 136 09/26/2013 0330   TRIG 102 09/26/2013 0330   HDL 26* 09/26/2013 0330   CHOLHDL 5.2 09/26/2013 0330   VLDL 20 09/26/2013 0330   LDLCALC 90 09/26/2013  0330   HgbA1C  Lab Results  Component Value Date   HGBA1C 6.5* 09/26/2013    Urine Drug Screen:   No results found for this basename: labopia,  cocainscrnur,  labbenz,  amphetmu,  thcu,  labbarb    Alcohol Level: No results found for this basename: ETH,  in the last 168 hours  Ct Head Wo Contrast 09/26/2013    No acute intracranial abnormality. Atrophy, chronic small vessel disease.     Ct Head (brain) Wo Contrast 09/25/2013    1. No acute intracranial pathology seen on CT.  2. Mild to moderate cortical volume loss and diffuse small vessel ischemic microangiopathy.    Dg Chest Port 1 View 09/26/2013    1. Endotracheal tube seen ending 3-4 cm above the carina.  2. Left subclavian line seen ending about the distal SVC.  3. Enteric tube noted extending below the diaphragm, though the side port is seen ending about the distal esophagus.  4. No acute cardiopulmonary process seen; borderline cardiomegaly noted.    Ct Head Wo Contrast   09/27/2013   IMPRESSION: Tube and catheter positions as described without pneumothorax. Left base atelectasis. Elsewhere lungs clear.     09/29/2013 IMPRESSION: Increasing opacity at the right lung base consistent with atelectasis versus developing pneumonia.   MRI of the brain  IMPRESSION:  Acute infarct in the posterior portion of the left MCA territory.  Small area of acute infarct in the right medial occipital lobe.  Severe stenosis distal left M1 segment with occlusion of the  posterior segment of the left middle cerebral artery corresponding  to acute infarct. Decreased flow in the anterior division of the  left middle cerebral artery.  Carotid Doppler  Bilateral: 1-39% ICA stenosis. Vertebral artery flow is antegrade.  2D Echocardiogram  - LVEF 70-75%, severe concentric LVH, trileaflet calcified aortic valve with calcified mass on non-coronary cusp and calcification of the tip of the left coronary cusp, no significant stenosis, trivial AI,  moderate TR, RVSP 53 (moderate pulmonary hypertension), diastolic dysfunction with indeterminate, but likely elevated LV Filling pressure.  EKG  Sinus or ectopic atrial rhythm rate 55 beats per minute.  For complete results please see formal report.   Therapy Recommendations pending    Physical exam  Temp:  [97.9 F (36.6 C)-100.6 F (38.1 C)] 98.6 F (37 C) (08/19 0800) Pulse Rate:  [48-120] 90 (  08/19 1200) Resp:  [15-29] 24 (08/19 1200) BP: (126-214)/(51-95) 126/79 mmHg (08/19 1200) SpO2:  [97 %-100 %] 100 % (08/19 1200) Weight:  [121 lb 7.6 oz (55.1 kg)] 121 lb 7.6 oz (55.1 kg) (08/19 0417)  General - thin, well developed, intubated.  Ophthalmologic - not cooperative on exam.  Cardiovascular - Regular rate and rhythm with no murmur.  Extremities - right LE BKA, Left LE wound at left shin and left groin hematoma much improved with left flank ecchymosis.  NEUROLOGIC:  Obtunded, not open eyes spontaneously, on voice stimulation, she attempted to open eyes once, PERRL, doll's eye present, positive corneal and gag, left UE spontaneously movement, but right UE 0/5, b/l LEs withdraw to pain. Left babinski negative, right BKA. Reflex 1+ throughout.  ASSESSMENT Victoria West is a 80 y.o. female presenting with aphasia and right hemiparesis . TPA was not administered secondary to late presentation. Status post left common carotid arteriogram, followed by mechanical thrombolysis of occluded inf division of LT MCA using a microguidewire - Dr Corliss Skains 09/26/2013 1:11 AM. Head CT revealed no acute findings. MRI confirmed left MCA stroke and M2 cut off. Likely extubate today. On aspirin 81 mg orally every day prior to admission. Now on no antithrombotics or anticoagulation for secondary stroke prevention secondary to left groin hematoma. Patient with resultant right hemiplegia.    Hyperlipidemia  - Cholesterol 136; LDL 90 - Pravachol prior to admission, resume after po assess  Hypertension  history  Dementia  Intubated on ventilator  Diabetes mellitus - hemoglobin A1c 6.5  Renal insufficiency  Low calcium level, resolved  Right BKA  PLAN Stroke - MRI confirmed left MCA stroke with M2 cut off.  - Hematoma and left femoral artery puncture site, requiring transfusion - hematoma resided and restart ASA for stroke prevention - start heparin for DVT prophylaxis - SCDs not ordered secondary to right BKA and no palpable pulse left lower extremity. - Anemia - stable and improved, today Hb 8.4 - Hypokalemia - resolved, now 3.8 - palliative care on board - had long discussion with son regarding her diagnosis, treatment plan and prognosis. He is waiting for his brother came from [place] to make joint decision. Currently pt full code.  Respiratory failure - extubate today, but developed tachypnea, tachycardia overnight, much better now - If pt not able to protect airway, will re-intubate as per current plan.  - ABG largely WNL - stat CXR - CCM on board  HTN - initially hypotension on dopamine, later cardene drip for HTN - overnight, pt developed again HTN, and cardene restarted  - pt has panda tube, put back on home meds - on lisinopril, amlodipine, coreg - increased coreg dose to 6.25mg  bid - close monitor   Left Groin hematoma - Hematoma and left femoral artery puncture site, requiring transfusion - hematoma resided   This patient is critically ill due to left MCA stroke and respiratory failure with numerous comorbidities and at significant risk of neurological worsening, death form cerebral edema, respiratory failure needing reintubation, anemia, bleeding, and infection. This patient's care requires constant monitoring of vital signs, hemodynamics, respiratory and cardiac monitoring, review of multiple databases, neurological assessment, discussion with family, other specialists and medical decision making of high complexity. I spent 45 minutes of neurocritical care time  in the care of this patient.  Marvel Plan, MD PhD Stroke Neurology 09/29/2013 1:21 PM    To contact Stroke Continuity provider, please refer to [email or web address]. After hours, contact General Neurology

## 2013-09-29 NOTE — Progress Notes (Signed)
SLP Cancellation Note  Patient Details Name: Victoria West MRN: [id number] DOB: 08-10-1924   Cancelled treatment:       Reason Eval/Treat Not Completed: Medical issues which prohibited therapy.  Will sign off; discussed with RN.    Blenda MountsCouture, [name] 09/29/2013, 2:01 PM

## 2013-09-29 NOTE — Progress Notes (Signed)
Patient ZO:XWRUE:Louis Tenorio      DOB: 1924/09/28      AVW:[id number]   [facility]    Subjective: Ms. Florentina AddisonKatie was more alert today. Attempts to answer some questions but gets mumbly. 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  Tracy at the beside. Goals remain continue supportive care with reintubation if needed.  Son from [place] coming tomorrow.  I have contacted two members of the [facility] on behalf of the family to see if other brother could visit. Letters placed on patient chart and reviewed these notes with French Anaracy.  Filed Vitals:   09/29/13 1200  BP: 126/79  Pulse: 90  Temp:   Resp: 24   Physical exam:  General: tried to open eyes early but preferentially keeping eyes shut. She did verbalize pain and isolated it to her head. Pupils not fully examined, but furrowing brow, some drooling Chest decreased but clear anteriorly CVS: regular, s1, S2 Abd: soft, not tender Ext: warm keeping right arm against chest, tries to grip  Neuro: still impaired significantly   Assessment and plan: 80 yr old african [place] female s/p Lg MCA stroke , extubated yesterday. Tolerated the night off vent. Trying to talk .  Son from [place] to arrive in the am.  1.  Full code remains in place  2.  Headache- I have asked the nurse to treat with tylenol down her tube.   Notes on patient shadow chart.   Yotam Rhine L. Ladona Ridgelaylor, MD MBA The Palliative Medicine Team at [facility] Team Phone: [phone] Pager: [phone] ( Use team phone after hours)   Total time:  20 min + time preparing letters 20 min  Haiden Rawlinson L. Ladona Ridgelaylor, MD MBA The Palliative Medicine Team at [facility] Team Phone: [phone] Pager: [phone] ( Use team phone after hours)

## 2013-09-29 NOTE — Progress Notes (Signed)
Patient experiencing labored breathing with hypertension. MD notified. ABG and 25 mcg Fentanyl ordered. Will continue to monitor. Ripley FraiseHarper, Jolyn Deshmukh D

## 2013-09-29 NOTE — Progress Notes (Signed)
eLink Physician-Brief Progress Note Patient Name: Victoria MorisKatie West DOB: January 30, 1925 MRN: [id number]   Date of Service  09/29/2013  HPI/Events of Note  `1. resp status seeems to be holding. She is somewhat arousable as well  2. BP still > 200 sbp despite hydralazine and lopressor prn  eICU Interventions  1. Monitor resp status 2. Restart cardene gtt     Intervention Category Major Interventions: Hypertension - evaluation and management  Bernadette Gores 09/29/2013, 4:59 AM

## 2013-09-29 NOTE — Progress Notes (Signed)
PULMONARY  / CRITICAL CARE MEDICINE   Name: Victoria West MRN: [id number] DOB: July 29, 1924  PHYSICIAN REQUESTING CONSULT: Noel Christmasharles Stewart  REASON FOR CONSULT: Vent and medical management     ADMISSION DATE:  09/25/2013  CHIEF COMPLAINT:  Code stroke  BRIEF PATIENT DESCRIPTION: 80 year old woman with unknown history [facility] with right sided paresis and aphasia, s/p mechanical thrombolysis of occluded inferior division of left MCA.  Complicated by hematoma, asystolic arrest, vent remains.  SIGNIFICANT EVENTS / STUDIES:  8/15: Arrived in ED as code stroke 8/15: CT head without acute findings; atrophy, chronic small vessel disease noted 8/16: IR procedure with mechanical thrombolysis of occluded inferior division of left MCA 8/26 CT head: No acute intracranial abnormality. Atrophy, chronic small vessel disease 8/16 Asystolic arrest from hemorrhagic shock / hematoma formation 8/17 Echocardiogram: LVEF 75%. Grade 1 diastolic dysfunction. Mild LA dilatation 8/18 MRI brain: Acute infarct in the posterior portion of the left MCA territory. Small area of acute infarct in the right medial occipital lobe. Severe stenosis distal left M1 segment with occlusion of the posterior segment of the left middle cerebral artery corresponding to acute infarct. Decreased flow in the anterior division of the left middle cerebral artery 8/18 Carotid Dopplers: Preliminary findings: Bilateral: 1-39% ICA stenosis. Vertebral artery flow is antegrade.  8/19 Episodes of mild resp distress. RASS -1 to -2 despite no sedation. Not F/C   LINES / TUBES: Aline left rad 8/15 >> 8/17 ETT 8/15 >> 8/18 left [place] 8/16 >>   CULTURES:   ANTIBIOTICS:   SUBJ: RASS -2 on no sedation. Presently in NAD  PHYSICAL EXAM VITAL SIGNS: Temp:  [97.9 F (36.6 C)-100.6 F (38.1 C)] 97.9 F (36.6 C) (08/19 0322) Pulse Rate:  [48-120] 99 (08/19 0800) Resp:  [15-32] 27 (08/19 0800) BP: (133-214)/(51-95) 167/64 mmHg  (08/19 0800) SpO2:  [97 %-100 %] 100 % (08/19 0800) FiO2 (%):  [30 %] 30 % (08/18 1100) Weight:  [55.1 kg (121 lb 7.6 oz)] 55.1 kg (121 lb 7.6 oz) (08/19 0417)   VENTILATOR SETTINGS: Vent Mode:  [-]  FiO2 (%):  [30 %] 30 % : PE General: RASS -2. Not F/C Neuro:  PULM: reduced bases CV: s1 s2 SR rrr no r GI: soft, +BS Extremities: BKA right, no edema  INS/OUTS: Intake/Output     08/18 0701 - 08/19 0700 08/19 0701 - 08/20 0700   I.V. (mL/kg) 288.3 (5.2) 80.7 (1.5)   NG/GT 841 10   Total Intake(mL/kg) 1129.3 (20.5) 90.7 (1.6)   Urine (mL/kg/hr) 1925 (1.5) 300 (2.3)   Total Output 1925 300   Net -795.7 -209.3           I have reviewed all of today's lab results. Relevant abnormalities are discussed in the A/P section  CXR: basilar atx  ASSESSMENT / PLAN:   PULMONARY A:  Acute resp failure due to AMS, poor airway control P:   Supp O2 to maintain SpO2 > 92% NTS PRN If fails, reintubate and discuss trach vs one-way extubation once rest of family arrives 8/20  CARDIOVASCULAR A:  S/p asystolic arrest secondary to hemorrhagic shock 8/16 CAD Bradycardia, resolved Hypotension, resolved Hypertension  P:   Cont PRN hydralazine Increase Carvedilol 8/19 Cont Amlodipine  Add Lisinopril 8/19 Wean nicardipine to off for SBP < 170 mmHg  RENAL A:  Acute kidney injury, resolved Hypokalemia, resolved P:   Monitor BMET intermittently Monitor I/Os Correct electrolytes as indicated  GASTROINTESTINAL A: Chronic PPI use P:  Change PPI to enteral Cont TFs  HEMATOLOGIC A: Anemia Acute blood loss, resolved Coagulopathy, resolved P:   DVT px: SQ heparin Monitor CBC intermittently Transfuse per usual ICU guidelines  INFECTIOUS A: no evidence of infection P:   Monitor temp, WBC count Micro and abx as above  ENDOCRINE A: DM 2 Hyperglycemia Possible hyperthyroidism (methimazole on med list) Now hypothyroid (TSH 15 8/16) P:   Holding methimazole Change  SSI to mod scale Recheck TSH next week  NEUROLOGIC A:  Acute CVA  Acute encephalopathy - possible ischemic injury during arrest P:   Stroke team managing    TODAY'S SUMMARY:    I have personally obtained a history, examined the patient, evaluated laboratory and imaging results, formulated the assessment and plan and placed orders.    Billy Fischer, MD ; [facility] [phone].  After 5:30 PM or weekends, call [phone]

## 2013-09-29 NOTE — Progress Notes (Signed)
   Re camera exam:   -- bp improved but sbp 199, with HR 91  - respiration status improved  Plan Monitor clinically Use hydralazine and lopressor prn for sbp < 180   Dr. Kalman ShanMurali Mischa Pollard, M.D., [facility] Pulmonary and Critical Care Medicine Staff Physician [place] [facility] [place] Pulmonary and Critical Care Pager: [phone], If no answer or between  15:00h - 7:00h: call [phone]  09/29/2013 1:34 AM

## 2013-09-29 NOTE — Progress Notes (Signed)
UR completed.  Yuval Nolet, RN BSN MHA CCM Trauma/Neuro ICU Case Manager [phone]  

## 2013-09-29 NOTE — Progress Notes (Signed)
   Had NP Joneen RoachPaul Hoffman evaluate patient  - altered sensorium  - paradoxical respiration  - poor protective reflexes  A Heading towards intubation next few to several hours  P Monitor closely; Bedside MD to evaluate  Dr. Kalman ShanMurali Asmara Backs, M.D., [facility].C.P Pulmonary and Critical Care Medicine Staff Physician [place] [facility] [place] Pulmonary and Critical Care Pager: [phone], If no answer or between  15:00h - 7:00h: call [phone]  09/29/2013 5:58 AM

## 2013-09-29 NOTE — Progress Notes (Signed)
Patient still hypertensive after 2.5 mg Metoprolol given. MD notified. No new orders given. Victoria West, [name] D

## 2013-09-29 NOTE — Progress Notes (Signed)
   RN calling elink  - new paradoxical respiration  -   Recent Labs Lab 09/25/13 2232 09/26/13 0400 09/29/13 0053  PHART  --  7.412 7.424  PCO2ART  --  30.5* 35.1  PO2ART  --  296.0* 193.0*  HCO3  --  19.0* 22.6  TCO2 21 19.9 23.7  O2SAT  --  99.1 99.8   Patient obtunded BP > 200 sbp  Concern this is pain response due to rib fracture  A Acute resp disterss ? Pain response  P Fentanyl x 1 and reassess If not improving, reintubate   Dr. Kalman ShanMurali Nakya Weyand, M.D., [facility].C.P Pulmonary and Critical Care Medicine Staff Physician [place] [facility] [place] Pulmonary and Critical Care Pager: [phone], If no answer or between  15:00h - 7:00h: call [phone]  09/29/2013 1:10 AM

## 2013-09-29 NOTE — Progress Notes (Signed)
Patient continues to have systolic blood pressures in the 200s. MD notified. Cardene reordered. Ripley FraiseHarper, Mitzy Naron D

## 2013-09-30 ENCOUNTER — Inpatient Hospital Stay (HOSPITAL_COMMUNITY): Payer: Medicare Other

## 2013-09-30 LAB — GLUCOSE, CAPILLARY
GLUCOSE-CAPILLARY: 168 mg/dL — AB (ref 70–99)
GLUCOSE-CAPILLARY: 176 mg/dL — AB (ref 70–99)
GLUCOSE-CAPILLARY: 181 mg/dL — AB (ref 70–99)
Glucose-Capillary: 109 mg/dL — ABNORMAL HIGH (ref 70–99)
Glucose-Capillary: 158 mg/dL — ABNORMAL HIGH (ref 70–99)
Glucose-Capillary: 165 mg/dL — ABNORMAL HIGH (ref 70–99)

## 2013-09-30 LAB — TYPE AND SCREEN
ABO/RH(D): B POS
ANTIBODY SCREEN: NEGATIVE
UNIT DIVISION: 0
UNIT DIVISION: 0
UNIT DIVISION: 0
Unit division: 0
Unit division: 0

## 2013-09-30 LAB — BASIC METABOLIC PANEL
Anion gap: 11 (ref 5–15)
BUN: 26 mg/dL — ABNORMAL HIGH (ref 6–23)
CHLORIDE: 110 meq/L (ref 96–112)
CO2: 24 meq/L (ref 19–32)
Calcium: 8.5 mg/dL (ref 8.4–10.5)
Creatinine, Ser: 0.66 mg/dL (ref 0.50–1.10)
GFR calc Af Amer: 89 mL/min — ABNORMAL LOW (ref >90)
GFR calc non Af Amer: 77 mL/min — ABNORMAL LOW (ref >90)
GLUCOSE: 166 mg/dL — AB (ref 70–99)
POTASSIUM: 3 meq/L — AB (ref 3.7–5.3)
Sodium: 145 mEq/L (ref 137–147)

## 2013-09-30 LAB — CBC
HEMATOCRIT: 22.1 % — AB (ref 36.0–46.0)
HEMATOCRIT: 22 % — AB (ref 36.0–46.0)
HEMOGLOBIN: 7.4 g/dL — AB (ref 12.0–15.0)
Hemoglobin: 7.4 g/dL — ABNORMAL LOW (ref 12.0–15.0)
MCH: 30.7 pg (ref 26.0–34.0)
MCH: 31 pg (ref 26.0–34.0)
MCHC: 33.5 g/dL (ref 30.0–36.0)
MCHC: 33.6 g/dL (ref 30.0–36.0)
MCV: 91.7 fL (ref 78.0–100.0)
MCV: 92.1 fL (ref 78.0–100.0)
Platelets: 197 10*3/uL (ref 150–400)
Platelets: 215 10*3/uL (ref 150–400)
RBC: 2.41 MIL/uL — ABNORMAL LOW (ref 3.87–5.11)
RBC: 2.39 MIL/uL — ABNORMAL LOW (ref 3.87–5.11)
RDW: 16.3 % — ABNORMAL HIGH (ref 11.5–15.5)
RDW: 16.3 % — AB (ref 11.5–15.5)
WBC: 9.1 10*3/uL (ref 4.0–10.5)
WBC: 8.6 10*3/uL (ref 4.0–10.5)

## 2013-09-30 MED ORDER — ASPIRIN EC 81 MG PO TBEC
81.0000 mg | DELAYED_RELEASE_TABLET | Freq: Every day | ORAL | Status: DC
  Filled 2013-09-30: qty 1

## 2013-09-30 MED ORDER — POTASSIUM CHLORIDE 20 MEQ/15ML (10%) PO LIQD
40.0000 meq | Freq: Four times a day (QID) | ORAL | Status: AC
  Administered 2013-09-30 – 2013-10-01 (×3): 40 meq via ORAL
  Filled 2013-09-30 (×5): qty 30

## 2013-09-30 MED ORDER — POTASSIUM CHLORIDE CRYS ER 20 MEQ PO TBCR
40.0000 meq | EXTENDED_RELEASE_TABLET | Freq: Four times a day (QID) | ORAL | Status: DC
  Filled 2013-09-30: qty 2

## 2013-09-30 MED ORDER — ASPIRIN 81 MG PO CHEW
81.0000 mg | CHEWABLE_TABLET | Freq: Every day | ORAL | Status: DC
  Administered 2013-09-30: 81 mg via ORAL
  Filled 2013-09-30: qty 1

## 2013-09-30 NOTE — Consult Note (Addendum)
WOC follow-up: Requested to re-assess left leg wound R/T large amt drainage from site.  Refer to previous progress notes on 8/17 for measurements and detailed assessment.  Left leg wound with large amt thick pink pus to a pocket in the woundbed when probed with a swab. Will increase dressing changes to Q day with Aquacel to absorb drainage and provide antimicrobial benefits.  Consider antibiotic coverage if aggressive plan of care is desired.   Please re-consult if further assistance is needed.  Thank-you,  Cammie Mcgeeawn Kaesha Kirsch MSN, RN, CWOCN, TwilightWCN-AP, CNS [phone]

## 2013-09-30 NOTE — Progress Notes (Signed)
Patient WU:JWJXB:Victoria West      DOB: [birth year]      JYN:[id number]  Patient's son Tammy SoursGreg [place] from [place] .  Reviewed in depth current deficits and explained what we would likely expect in terms of recover (patient will likely not reach level of intensive rehab capablity and will need SNF level care.  Doubt patient will be able to rehab to level of reasonable existence). Son's are going to talk over making pro-active decisions regarding code status, feeding tubes, and end of life care.  They will need daily updates from the primary services and our Palliative Medicine Team will follow along and re-discuss options.  At the time of this meeting they have not requested any change in code status. I updated them on her leg wound.  We will request initiation of antibiotic coverage.  Discussed with Stroke NP   Total time 145 pm- 212 pm   Shigeru Lampert L. Ladona Ridgelaylor, MD MBA The Palliative Medicine Team at [facility] Team Phone: [phone] Pager: [phone] ( Use team phone after hours)

## 2013-09-30 NOTE — Progress Notes (Signed)
Patient AV:WUJWJ:Victoria West      DOB: 04-15-1924      XBJ:[id number]   [facility]    Subjective: patient awakesn easily but has a left gaze preference. Localizes my voice to the left.  Doesn't focus well visually.  May have decreased vision which is hard to test but appears bilateral deficits.  Speech still muttled but she can convey some emotions like pride when I tell her how beautiful she is she will express smile and turns head down.  She was able to squeeze with her left hand but not her right all though she acknowledges that she will try to squeeze on her right side. [place]  Will have my partner revisit with them regarding goals of care in the am. Please call [phone] with any problems in the mean time.     Filed Vitals:   09/30/13 0700  BP: 162/64  Pulse: 78  Temp:   Resp: 21   Physical exam:  General:  Awakens spontaneously and easily PERRL on best exam but leftward gaze noted Chest decreased but clear, no rhonchi, CVS: regular, S1, S2 Abd: soft,  Ext: AKA right , left limb with wound that has discharge from under the bandage-  Dark bloody mucousy discharge. Neuro: visual deficits, left gaze preference.   Assessment and plan: 80 yr old african [place] female s/p left MCA stroke required intubation.  Now extubated and tolerating [place]. Course complicated by residual hemiparesis on the right , hypertension and tachycardia, left limb wound  1.  Full code.  [place] from [place] to arrive tonight.  I will ask my partner to follow up with regoal.  2.  Limb wound: seen by wound care. Continue aquacel dressing  Family requesting treat the treatable and reintubate if needed.  Son will not address additional goals until brother arrives from [place]     Total time 15 min 830 am to 845 am  Victoria West L. Victoria Ridgelaylor, MD MBA The Palliative Medicine Team at [facility] Team Phone: [phone] Pager: [phone] ( Use team phone after  hours)

## 2013-09-30 NOTE — Progress Notes (Signed)
Stroke Team Progress Note  HISTORY Victoria West is an 80 y.o. female with a history of hypertension, hypothyroidism, glaucoma and dementia who was brought to the emergency room after being found unable to speak as well as unable to move her right side and gaze to the left side. There is no previous history of stroke. CT scan of her head showed no acute intracranial abnormality. She was last seen well at 1700 today. She was beyond time window for intervention with TPA when she arrived in the emergency room. NIH stroke score was 24.   LSN: [id number] on 09/25/2013  tPA Given: No: Beyond time under for treatment consideration  MRankin: 4  S/P Left common carotid arteriogram,followed by mechanical thrombolysis of occluded inf division of LT MCA using a microguidewire - Dr Corliss Skains 09/26/2013 1:11 AM  SUBJECTIVE No family member in room during round. She was extubated and currently remains to be extubated. Overnight, her condition did not change. Still on cardene drip for BP control. Her O2 sat stable. She pulled out her Panda tube overnight so po meds and tube feeding were stopped. Her BP elevated and cardene rate increased accordingly. Will put back Panda tube in am. Her son will come from [place] today and palliative care has scheduled family meeting this pm. Currently full code.  OBJECTIVE Most recent Vital Signs: Filed Vitals:   09/30/13 1014 09/30/13 1100 09/30/13 1124 09/30/13 1200  BP: 167/68 160/64  141/57  Pulse:  81  78  Temp:   98.5 F (36.9 C)   TempSrc:   Oral   Resp:  26  19  Height:      Weight:      SpO2:  100%  100%   CBG (last 3)   Recent Labs  09/30/13 0337 09/30/13 0749 09/30/13 1123  GLUCAP 168* 109* 158*    IV Fluid Intake:   . sodium chloride 10 mL/hr at 09/30/13 1200  . niCARDipine 3 mg/hr (09/30/13 1200)    MEDICATIONS  . amLODipine  10 mg Per Tube Daily  . antiseptic oral rinse  7 mL Mouth Rinse QID  . aspirin  81 mg Oral Daily  . carvedilol  6.25 mg Per Tube  BID WC  . chlorhexidine  15 mL Mouth Rinse BID  . feeding supplement (VITAL HIGH PROTEIN)  1,000 mL Per Tube Q24H  . free water  100 mL Per Tube 3 times per day  . heparin subcutaneous  5,000 Units Subcutaneous 3 times per day  . insulin aspart  0-15 Units Subcutaneous 6 times per day  . lisinopril  20 mg Per Tube Daily  . pantoprazole sodium  40 mg Per Tube Q1200   PRN:  acetaminophen (TYLENOL) oral liquid 160 mg/5 mL, albuterol, hydrALAZINE, metoprolol, ondansetron (ZOFRAN) IV  Diet:    no liquids Activity:  Bedrest DVT Prophylaxis:  Heparin subq.   CLINICALLY SIGNIFICANT STUDIES Basic Metabolic Panel:   Recent Labs Lab 09/29/13 1020 09/30/13 0500  NA 144 145  K 3.8 3.0*  CL 107 110  CO2 23 24  GLUCOSE 222* 166*  BUN 25* 26*  CREATININE 0.69 0.66  CALCIUM 8.7 8.5   Liver Function Tests:   Recent Labs Lab 09/26/13 0330 09/27/13 0459  AST 7 11  ALT <5 <5  ALKPHOS 60 53  BILITOT <0.2* 0.3  PROT 4.1* 4.6*  ALBUMIN 2.0* 2.4*   CBC:  Recent Labs Lab 09/26/13 0209  09/27/13 0459  09/29/13 1020 09/30/13 0500  WBC 3.3*  < >  11.7*  < > 12.4* 9.1  NEUTROABS 2.2  --  10.1*  --   --   --   HGB 7.7*  < > 7.5*  < > 8.4* 7.4*  HCT 24.0*  < > 21.4*  < > 24.9* 22.1*  MCV 100.4*  < > 89.5  < > 90.5 91.7  PLT 241  < > 143*  < > 200 197  < > = values in this interval not displayed. Coagulation:   Recent Labs Lab 09/25/13 2225 09/26/13 0209 09/26/13 0630 09/27/13 0459  LABPROT 13.6 17.1* 18.5* 15.9*  INR 1.04 1.39 1.54* 1.27   Cardiac Enzymes:   Recent Labs Lab 09/26/13 0330 09/26/13 1210 09/26/13 2158 09/27/13 0459  CKTOTAL 46 97  --   --   CKMB  --  4.9*  --   --   TROPONINI  --  0.45* 0.50* 0.45*   Urinalysis: No results found for this basename: COLORURINE, APPERANCEUR, LABSPEC, PHURINE, GLUCOSEU, HGBUR, BILIRUBINUR, KETONESUR, PROTEINUR, UROBILINOGEN, NITRITE, LEUKOCYTESUR,  in the last 168 hours Lipid Panel    Component Value Date/Time   CHOL 136  09/26/2013 0330   TRIG 102 09/26/2013 0330   HDL 26* 09/26/2013 0330   CHOLHDL 5.2 09/26/2013 0330   VLDL 20 09/26/2013 0330   LDLCALC 90 09/26/2013 0330   HgbA1C  Lab Results  Component Value Date   HGBA1C 6.5* 09/26/2013    Urine Drug Screen:   No results found for this basename: labopia,  cocainscrnur,  labbenz,  amphetmu,  thcu,  labbarb    Alcohol Level: No results found for this basename: ETH,  in the last 168 hours  Ct Head Wo Contrast 09/26/2013    No acute intracranial abnormality. Atrophy, chronic small vessel disease.     Ct Head (brain) Wo Contrast 09/25/2013    1. No acute intracranial pathology seen on CT.  2. Mild to moderate cortical volume loss and diffuse small vessel ischemic microangiopathy.    Dg Chest Port 1 View 09/26/2013    1. Endotracheal tube seen ending 3-4 cm above the carina.  2. Left subclavian line seen ending about the distal SVC.  3. Enteric tube noted extending below the diaphragm, though the side port is seen ending about the distal esophagus.  4. No acute cardiopulmonary process seen; borderline cardiomegaly noted.    Ct Head Wo Contrast   09/27/2013   IMPRESSION: Tube and catheter positions as described without pneumothorax. Left base atelectasis. Elsewhere lungs clear.     09/29/2013 IMPRESSION: Increasing opacity at the right lung base consistent with atelectasis versus developing pneumonia.   MRI of the brain  IMPRESSION:  Acute infarct in the posterior portion of the left MCA territory.  Small area of acute infarct in the right medial occipital lobe.  Severe stenosis distal left M1 segment with occlusion of the  posterior segment of the left middle cerebral artery corresponding  to acute infarct. Decreased flow in the anterior division of the  left middle cerebral artery.  Carotid Doppler  Bilateral: 1-39% ICA stenosis. Vertebral artery flow is antegrade.  2D Echocardiogram  - LVEF 70-75%, severe concentric LVH, trileaflet calcified  aortic valve with calcified mass on non-coronary cusp and calcification of the tip of the left coronary cusp, no significant stenosis, trivial AI, moderate TR, RVSP 53 (moderate pulmonary hypertension), diastolic dysfunction with indeterminate, but likely elevated LV Filling pressure.  EKG  Sinus or ectopic atrial rhythm rate 55 beats per minute.  For complete results please see  formal report.   Therapy Recommendations pending    Physical exam  Temp:  [97.2 F (36.2 C)-98.5 F (36.9 C)] 98.5 F (36.9 C) (08/20 1124) Pulse Rate:  [74-102] 78 (08/20 1200) Resp:  [14-26] 19 (08/20 1200) BP: (95-171)/(41-79) 141/57 mmHg (08/20 1200) SpO2:  [100 %] 100 % (08/20 1200) Weight:  [119 lb 11.4 oz (54.3 kg)] 119 lb 11.4 oz (54.3 kg) (08/20 0339)  General - thin, well developed, intubated.  Ophthalmologic - not cooperative on exam.  Cardiovascular - Regular rate and rhythm with no murmur.  Respiratory - bilateral coarse rhonchi and decreased breath sounds.  Extremities - right LE BKA, Left LE wound at left shin and left groin hematoma much improved with left flank ecchymosis.  NEUROLOGIC:  Obtunded, not open eyes spontaneously, on voice stimulation, she attempted to open eyes once, PERRL, doll's eye present, positive corneal and gag, left UE spontaneously movement, but right UE 0/5, b/l LEs withdraw to pain. Left babinski negative, right BKA. Reflex 1+ throughout.  ASSESSMENT Victoria West is a 80 y.o. female presenting with aphasia and right hemiparesis . TPA was not administered secondary to late presentation. Status post left common carotid arteriogram, followed by mechanical thrombolysis of occluded inf division of LT MCA using a microguidewire - Dr Corliss Skains 09/26/2013 1:11 AM. Head CT revealed no acute findings. MRI confirmed left MCA stroke and M2 cut off. Likely extubate today. On aspirin 81 mg orally every day prior to admission. Now on no antithrombotics or anticoagulation for  secondary stroke prevention secondary to left groin hematoma. Patient with resultant right hemiplegia.    Hyperlipidemia  - Cholesterol 136; LDL 90 - Pravachol prior to admission, resume after po assess  Hypertension history  Dementia  Intubated on ventilator  Diabetes mellitus - hemoglobin A1c 6.5  Renal insufficiency  Low calcium level, resolved  Right BKA  PLAN Stroke - MRI confirmed left MCA stroke with M2 cut off.  - restart ASA for stroke prevention. Changed to 81mg  today after loading of 325mg  yesterday. - start heparin for DVT prophylaxis - SCDs not ordered secondary to right BKA and no palpable pulse left lower extremity. - Anemia - decreased Hb to 7.4 today, need to close monitor. - Hypokalemia - this am 3.0, will need supplement. Wait for the tube clearance to start oral supplement. - palliative care on board - had long discussion yesterday with son regarding her diagnosis, treatment plan and prognosis. He is waiting for his brother came from [place] today to make joint decision. Currently pt full code.  Respiratory failure - remains extubated - If pt not able to protect airway, will re-intubate as per current plan.  - ABG largely WNL 09/29/13 - CXR unchanged stable - CCM on board  HTN - initially hypotension on dopamine, later cardene drip for HTN - still on cardene drip due to HTN - pt pulled off panda tube overnight, will need tube insertion and then continue BP meds - on lisinopril, amlodipine, coreg - close monitor   Left Groin hematoma - Hematoma and left femoral artery puncture site, requiring transfusion - hematoma resided  - no active bleeding  This patient is critically ill due to left MCA stroke and respiratory failure with numerous comorbidities and at significant risk of neurological worsening, death form cerebral edema, respiratory failure needing reintubation, anemia, bleeding, and infection. This patient's care requires constant monitoring of  vital signs, hemodynamics, respiratory and cardiac monitoring, review of multiple databases, neurological assessment, discussion with family, other specialists and  medical decision making of high complexity. I spent 35 minutes of neurocritical care time in the care of this patient.  Marvel Plan, MD PhD Stroke Neurology 09/30/2013 12:53 PM    To contact Stroke Continuity provider, please refer to [email or web address]. After hours, contact General Neurology

## 2013-10-01 ENCOUNTER — Inpatient Hospital Stay (HOSPITAL_COMMUNITY): Payer: Medicare Other

## 2013-10-01 DIAGNOSIS — I6340 Cerebral infarction due to embolism of unspecified cerebral artery: Principal | ICD-10-CM

## 2013-10-01 LAB — CBC
HCT: 19 % — ABNORMAL LOW (ref 36.0–46.0)
HCT: 28.3 % — ABNORMAL LOW (ref 36.0–46.0)
HEMOGLOBIN: 6.2 g/dL — AB (ref 12.0–15.0)
Hemoglobin: 9.6 g/dL — ABNORMAL LOW (ref 12.0–15.0)
MCH: 30.1 pg (ref 26.0–34.0)
MCH: 29.9 pg (ref 26.0–34.0)
MCHC: 32.6 g/dL (ref 30.0–36.0)
MCHC: 33.9 g/dL (ref 30.0–36.0)
MCV: 92.2 fL (ref 78.0–100.0)
MCV: 88.2 fL (ref 78.0–100.0)
PLATELETS: 207 10*3/uL (ref 150–400)
Platelets: 229 10*3/uL (ref 150–400)
RBC: 2.06 MIL/uL — ABNORMAL LOW (ref 3.87–5.11)
RBC: 3.21 MIL/uL — ABNORMAL LOW (ref 3.87–5.11)
RDW: 16.3 % — AB (ref 11.5–15.5)
RDW: 16.3 % — AB (ref 11.5–15.5)
WBC: 7.2 10*3/uL (ref 4.0–10.5)
WBC: 7.2 10*3/uL (ref 4.0–10.5)

## 2013-10-01 LAB — GLUCOSE, CAPILLARY
GLUCOSE-CAPILLARY: 125 mg/dL — AB (ref 70–99)
GLUCOSE-CAPILLARY: 154 mg/dL — AB (ref 70–99)
GLUCOSE-CAPILLARY: 157 mg/dL — AB (ref 70–99)
GLUCOSE-CAPILLARY: 158 mg/dL — AB (ref 70–99)
GLUCOSE-CAPILLARY: 158 mg/dL — AB (ref 70–99)
GLUCOSE-CAPILLARY: 160 mg/dL — AB (ref 70–99)
Glucose-Capillary: 144 mg/dL — ABNORMAL HIGH (ref 70–99)

## 2013-10-01 LAB — BASIC METABOLIC PANEL
Anion gap: 7 (ref 5–15)
BUN: 31 mg/dL — ABNORMAL HIGH (ref 6–23)
CALCIUM: 8.3 mg/dL — AB (ref 8.4–10.5)
CO2: 26 mEq/L (ref 19–32)
Chloride: 114 mEq/L — ABNORMAL HIGH (ref 96–112)
Creatinine, Ser: 0.72 mg/dL (ref 0.50–1.10)
GFR, EST AFRICAN AMERICAN: 87 mL/min — AB (ref >90)
GFR, EST NON AFRICAN AMERICAN: 75 mL/min — AB (ref >90)
GLUCOSE: 130 mg/dL — AB (ref 70–99)
Potassium: 4.8 mEq/L (ref 3.7–5.3)
SODIUM: 147 meq/L (ref 137–147)

## 2013-10-01 LAB — OCCULT BLOOD X 1 CARD TO LAB, STOOL: FECAL OCCULT BLD: NEGATIVE

## 2013-10-01 LAB — PREPARE RBC (CROSSMATCH)

## 2013-10-01 MED ORDER — BACITRACIN ZINC 500 UNIT/GM EX OINT
TOPICAL_OINTMENT | Freq: Every day | CUTANEOUS | Status: DC
  Administered 2013-10-01 – 2013-10-03 (×3): via TOPICAL
  Administered 2013-10-04: 1 via TOPICAL
  Administered 2013-10-05 – 2013-10-11 (×7): via TOPICAL
  Filled 2013-10-01: qty 28.35

## 2013-10-01 MED ORDER — SODIUM CHLORIDE 0.9 % IV SOLN
80.0000 mg | Freq: Once | INTRAVENOUS | Status: AC
  Administered 2013-10-01: 80 mg via INTRAVENOUS
  Filled 2013-10-01: qty 80

## 2013-10-01 MED ORDER — HYDRALAZINE HCL 20 MG/ML IJ SOLN
10.0000 mg | INTRAMUSCULAR | Status: DC | PRN
  Administered 2013-10-01: 40 mg via INTRAVENOUS
  Administered 2013-10-02: 20 mg via INTRAVENOUS
  Administered 2013-10-02 – 2013-10-03 (×3): 40 mg via INTRAVENOUS
  Administered 2013-10-04: 20 mg via INTRAVENOUS
  Administered 2013-10-04: 40 mg via INTRAVENOUS
  Administered 2013-10-04 – 2013-10-08 (×7): 20 mg via INTRAVENOUS
  Administered 2013-10-08: 40 mg via INTRAVENOUS
  Administered 2013-10-08: 20 mg via INTRAVENOUS
  Filled 2013-10-01 (×2): qty 2
  Filled 2013-10-01 (×2): qty 1
  Filled 2013-10-01: qty 2
  Filled 2013-10-01: qty 1
  Filled 2013-10-01: qty 2
  Filled 2013-10-01 (×2): qty 1
  Filled 2013-10-01: qty 2
  Filled 2013-10-01: qty 1
  Filled 2013-10-01: qty 2
  Filled 2013-10-01 (×3): qty 1

## 2013-10-01 MED ORDER — SODIUM CHLORIDE 0.9 % IV SOLN: Freq: Once | INTRAVENOUS | Status: DC

## 2013-10-01 MED ORDER — SODIUM CHLORIDE 0.9 % IV SOLN
8.0000 mg/h | INTRAVENOUS | Status: DC
  Administered 2013-10-01: 8 mg/h via INTRAVENOUS
  Filled 2013-10-01 (×4): qty 80

## 2013-10-01 NOTE — Progress Notes (Signed)
Has tolerated extubation reasonably well. No ongoing pulmonary or CCM issues. Goals of care are being defined. I have shared with family and Dr Ladona Ridgelaylor that DNR/DNI would be appropriate as repeated intubations would not lead to a favorable outcome. She is a very poor candidate for ACLS for the same reason and these measures should not be undertaken in my opinion.   PCCM will sign off. Please call if we can be of further assistance  Billy Fischeravid Canisha Issac, MD ; [facility] service Mobile [phone].  After 5:30 PM or weekends, call [phone]

## 2013-10-01 NOTE — Clinical Social Work Note (Signed)
Clinical Social Worker continuing to follow patient and family for support and discharge planning needs.  Patient son from [place] has arrived and family has met with Palliative Medicine.  CSW spoke with RN who states that patient family met with Palliative Medicine team again today to further discuss patient code status and plans for continued aggressive care vs. Comfort care.  Patient family is not yet ready to make a decision regarding patient further care.  Patient was taken off Cardene this morning and still has a NG tube/PANDA in place.  Patient is not a candidate to return to [facility] until a more permanent means of feeding is established.  CSW remains available for support and to facilitate patient discharge needs once medically ready.  Barbette Or, [place]

## 2013-10-01 NOTE — Progress Notes (Signed)
  This NP discussed medical situation with Neurology team and then spoke with son Earl Lites(Gregory) by telephone to offer support and help clarify GOC.  He tells me that as a family they are discussing and working on  "a plan"  Strong recommendation for family to clarify and document advanced directives; most specific to code status, and continued medical interventions to prolong life in an overall poor prognosis situation, in order to enhance patient centered end of life care.  Discussed with Dr Roda ShuttersXu.  He plans further discussion with family later today.  Lorinda CreedMary Larach NP  Palliative Medicine Team Team Phone # [phone] Pager [phone]

## 2013-10-01 NOTE — Progress Notes (Signed)
Pt has US of abdomen and LL extremity ordered bc of low Hgb.  I have tried to get a time coordinated with Morrie Sheldonshley in [place]. I was unable to take the pt down to ultrasound bc I was in MRI with my other pt.  Morrie Sheldonshley said they can come do these scans portable but it would be during the evening shift.

## 2013-10-01 NOTE — Progress Notes (Signed)
Stroke Team Progress Note  HISTORY Victoria West is an 80 y.o. female with a history of hypertension, hypothyroidism, glaucoma and dementia who was brought to the emergency room after being found unable to speak as well as unable to move her right side and gaze to the left side. There is no previous history of stroke. CT scan of her head showed no acute intracranial abnormality. She was last seen well at 1700 today. She was beyond time window for intervention with TPA when she arrived in the emergency room. NIH stroke score was 24.   LSN: 1700 on 09/25/2013  tPA Given: No: Beyond time under for treatment consideration  MRankin: 4  S/P Left common carotid arteriogram,followed by mechanical thrombolysis of occluded inf division of LT MCA using a microguidewire - Dr Corliss Skains 09/26/2013 1:11 AM  SUBJECTIVE No family member in room during round. Pt this am Hb dropped to 6.2. Had large bowel movement, appeared to be black stool. However, stool occult blood testing negative. Give 2 units RBC transfusion. Still concerning for GIB, put her on protonix drip, but will also explore other source of bleeding. Discontinued ASA and heparin subq due to dropping Hb. Talked with son Sharlot Gowda over the phone and he is yet to make decision for pt regarding aggressive therapy vs. Palliative care.  Currently full code. Family will make decision soon. BP still high, on cardene drip.  OBJECTIVE Most recent Vital Signs: Filed Vitals:   10/01/13 1130 10/01/13 1145 10/01/13 1200 10/01/13 1300  BP:   181/58 185/62  Pulse: 50 58 55 61  Temp:  97.9 F (36.6 C) 98.7 F (37.1 C)   TempSrc:  Oral Oral   Resp: 14 18 19 15   Height:      Weight:      SpO2: 100% 100% 100% 100%   CBG (last 3)   Recent Labs  10/01/13 0334 10/01/13 0802 10/01/13 1203  GLUCAP 158* 144* 157*    IV Fluid Intake:   . sodium chloride 10 mL/hr at 10/01/13 1300  . niCARDipine Stopped (09/30/13 1800)  . pantoprozole (PROTONIX) infusion 8 mg/hr  (10/01/13 1300)    MEDICATIONS  . sodium chloride   Intravenous Once  . amLODipine  10 mg Per Tube Daily  . antiseptic oral rinse  7 mL Mouth Rinse QID  . bacitracin   Topical Daily  . carvedilol  6.25 mg Per Tube BID WC  . chlorhexidine  15 mL Mouth Rinse BID  . feeding supplement (VITAL HIGH PROTEIN)  1,000 mL Per Tube Q24H  . free water  100 mL Per Tube 3 times per day  . insulin aspart  0-15 Units Subcutaneous 6 times per day  . lisinopril  20 mg Per Tube Daily   PRN:  acetaminophen (TYLENOL) oral liquid 160 mg/5 mL, albuterol, hydrALAZINE, metoprolol, ondansetron (ZOFRAN) IV  Diet:    no liquids Activity:  Bedrest DVT Prophylaxis:  Heparin subq.   CLINICALLY SIGNIFICANT STUDIES Basic Metabolic Panel:   Recent Labs Lab 09/30/13 0500 10/01/13 0600  NA 145 147  K 3.0* 4.8  CL 110 114*  CO2 24 26  GLUCOSE 166* 130*  BUN 26* 31*  CREATININE 0.66 0.72  CALCIUM 8.5 8.3*   Liver Function Tests:   Recent Labs Lab 09/26/13 0330 09/27/13 0459  AST 7 11  ALT <5 <5  ALKPHOS 60 53  BILITOT <0.2* 0.3  PROT 4.1* 4.6*  ALBUMIN 2.0* 2.4*   CBC:  Recent Labs Lab 09/26/13 0209  09/27/13 0459  09/30/13 1700 10/01/13 0600  WBC 3.3*  < > 11.7*  < > 8.6 7.2  NEUTROABS 2.2  --  10.1*  --   --   --   HGB 7.7*  < > 7.5*  < > 7.4* 6.2*  HCT 24.0*  < > 21.4*  < > 22.0* 19.0*  MCV 100.4*  < > 89.5  < > 92.1 92.2  PLT 241  < > 143*  < > 215 229  < > = values in this interval not displayed. Coagulation:   Recent Labs Lab 09/25/13 2225 09/26/13 0209 09/26/13 0630 09/27/13 0459  LABPROT 13.6 17.1* 18.5* 15.9*  INR 1.04 1.39 1.54* 1.27   Cardiac Enzymes:   Recent Labs Lab 09/26/13 0330 09/26/13 1210 09/26/13 2158 09/27/13 0459  CKTOTAL 46 97  --   --   CKMB  --  4.9*  --   --   TROPONINI  --  0.45* 0.50* 0.45*   Urinalysis: No results found for this basename: COLORURINE, APPERANCEUR, LABSPEC, PHURINE, GLUCOSEU, HGBUR, BILIRUBINUR, KETONESUR, PROTEINUR,  UROBILINOGEN, NITRITE, LEUKOCYTESUR,  in the last 168 hours Lipid Panel    Component Value Date/Time   CHOL 136 09/26/2013 0330   TRIG 102 09/26/2013 0330   HDL 26* 09/26/2013 0330   CHOLHDL 5.2 09/26/2013 0330   VLDL 20 09/26/2013 0330   LDLCALC 90 09/26/2013 0330   HgbA1C  Lab Results  Component Value Date   HGBA1C 6.5* 09/26/2013    Urine Drug Screen:   No results found for this basename: labopia,  cocainscrnur,  labbenz,  amphetmu,  thcu,  labbarb    Alcohol Level: No results found for this basename: ETH,  in the last 168 hours  Occult blood in stool - negative.  Ct Head Wo Contrast 09/26/2013    No acute intracranial abnormality. Atrophy, chronic small vessel disease.     Ct Head (brain) Wo Contrast 09/25/2013    1. No acute intracranial pathology seen on CT.  2. Mild to moderate cortical volume loss and diffuse small vessel ischemic microangiopathy.    Dg Chest Port 1 View 09/26/2013    1. Endotracheal tube seen ending 3-4 cm above the carina.  2. Left subclavian line seen ending about the distal SVC.  3. Enteric tube noted extending below the diaphragm, though the side port is seen ending about the distal esophagus.  4. No acute cardiopulmonary process seen; borderline cardiomegaly noted.    Ct Head Wo Contrast   09/27/2013   IMPRESSION: Tube and catheter positions as described without pneumothorax. Left base atelectasis. Elsewhere lungs clear.     09/29/2013 IMPRESSION: Increasing opacity at the right lung base consistent with atelectasis versus developing pneumonia.   MRI of the brain  IMPRESSION:  Acute infarct in the posterior portion of the left MCA territory.  Small area of acute infarct in the right medial occipital lobe.  Severe stenosis distal left M1 segment with occlusion of the  posterior segment of the left middle cerebral artery corresponding  to acute infarct. Decreased flow in the anterior division of the  left middle cerebral artery.  Carotid Doppler   Bilateral: 1-39% ICA stenosis. Vertebral artery flow is antegrade.  2D Echocardiogram  - LVEF 70-75%, severe concentric LVH, trileaflet calcified aortic valve with calcified mass on non-coronary cusp and calcification of the tip of the left coronary cusp, no significant stenosis, trivial AI, moderate TR, RVSP 53 (moderate pulmonary hypertension), diastolic dysfunction with indeterminate, but likely elevated LV Filling pressure.  EKG  Sinus or ectopic atrial rhythm rate 55 beats per minute.  For complete results please see formal report.   Therapy Recommendations pending    Physical exam  Temp:  [97.6 F (36.4 C)-98.7 F (37.1 C)] 98.7 F (37.1 C) (08/21 1200) Pulse Rate:  [50-86] 61 (08/21 1300) Resp:  [13-26] 15 (08/21 1300) BP: (139-193)/(46-81) 185/62 mmHg (08/21 1300) SpO2:  [100 %] 100 % (08/21 1300) Weight:  [116 lb 6.5 oz (52.8 kg)] 116 lb 6.5 oz (52.8 kg) (08/21 0500)  General - thin, well developed, eyes closed.  Ophthalmologic - not cooperative on exam.  Cardiovascular - Regular rate and rhythm with no murmur.  Respiratory - bilateral coarse rhonchi and decreased breath sounds.  Extremities - right LE BKA, Left LE wound at left shin and left groin hematoma much improved with left flank ecchymosis.  NEUROLOGIC:  Obtunded, not open eyes spontaneously, on voice stimulation, she attempted to open eyes, mumbles words but intelligible, PERRL, sluggish to light reaction, doll's eye present, positive corneal and gag, left UE spontaneously movement, but right UE 2/5, b/l LEs withdraw to pain and against gravity. Left babinski negative, right BKA. Reflex 1+ throughout.  ASSESSMENT Ms. Lianne MorisKatie Woodrum is a 80 y.o. female presenting with aphasia and right hemiparesis . TPA was not administered secondary to late presentation. Status post left common carotid arteriogram, followed by mechanical thrombolysis of occluded inf division of LT MCA using a microguidewire - Dr Corliss Skainseveshwar  09/26/2013 1:11 AM. Head CT revealed no acute findings. MRI confirmed left MCA stroke and M2 cut off. Likely extubate today. On aspirin 81 mg orally every day prior to admission. Now on no antithrombotics or anticoagulation for secondary stroke prevention secondary to left groin hematoma and subsequent anemia. Patient with resultant right hemiplegia.    Hyperlipidemia  - Cholesterol 136; LDL 90 - Pravachol prior to admission, resume after po assess  Hypertension history  Dementia  Intubated on ventilator - extubated  Diabetes mellitus - hemoglobin A1c 6.5  Renal insufficiency  Low calcium level, resolved  Right BKA  Anemia - due to groin hematoma and later GIB, requiring blood transfusion.  PLAN Stroke - MRI confirmed left MCA stroke with M2 cut off.  - discontinued ASA due to low Hb.  - hold off heparin for DVT prophylaxis due to dropping Hb - SCDs not ordered secondary to right BKA and no palpable pulse left lower extremity. - Anemia - decreased Hb to 6.2 today, need blood transfusion 2 units. - Hypokalemia - resolved, this am 4.8 - palliative care on board - had long discussion with son over the phone today, informed him about diagnosis, treatment options, and possible prognosis. Family will discuss with state status. Currently pt full code.  Respiratory failure - remains extubated - If pt not able to protect airway, will re-intubate as per current plan.  - ABG largely WNL 09/29/13 - CXR unchanged stable - CCM on board  ? GIB - blood stool this am with dropping hb - Hb 6.2 this am - stool guaiac neg - 2 units blood transfusion - protonix drip  - will consider GI consult if above not able to control bleeding.  HTN - initially hypotension on dopamine, later cardene drip for HTN - still on cardene drip due to HTN - pt pulled off panda tube overnight, will need tube insertion and then continue BP meds - on lisinopril, amlodipine, coreg - close monitor   Left Groin  hematoma - Hematoma and left femoral artery puncture site, requiring  transfusion - hematoma resided  - no active bleeding  Anemia - 2 units PRBC transfusion - explore source of bleeding - [place] retroperitoneal and left leg for hematoma - close monitor  This patient is critically ill due to left MCA stroke and respiratory failure with numerous comorbidities and at significant risk of neurological worsening, death form cerebral edema, respiratory failure needing reintubation, anemia, bleeding, and infection. This patient's care requires constant monitoring of vital signs, hemodynamics, respiratory and cardiac monitoring, review of multiple databases, neurological assessment, discussion with family, other specialists and medical decision making of high complexity. I spent 35 minutes of neurocritical care time in the care of this patient.  Marvel Plan, MD PhD Stroke Neurology 10/01/2013 2:19 PM    To contact Stroke Continuity provider, please refer to [email or web address]. After hours, contact General Neurology

## 2013-10-02 ENCOUNTER — Inpatient Hospital Stay (HOSPITAL_COMMUNITY): Payer: Medicare Other

## 2013-10-02 LAB — GLUCOSE, CAPILLARY
GLUCOSE-CAPILLARY: 159 mg/dL — AB (ref 70–99)
Glucose-Capillary: 164 mg/dL — ABNORMAL HIGH (ref 70–99)
Glucose-Capillary: 169 mg/dL — ABNORMAL HIGH (ref 70–99)
Glucose-Capillary: 163 mg/dL — ABNORMAL HIGH (ref 70–99)
Glucose-Capillary: 89 mg/dL (ref 70–99)
Glucose-Capillary: 167 mg/dL — ABNORMAL HIGH (ref 70–99)

## 2013-10-02 LAB — TYPE AND SCREEN
ABO/RH(D): B POS
ANTIBODY SCREEN: NEGATIVE
Unit division: 0
Unit division: 0

## 2013-10-02 LAB — BASIC METABOLIC PANEL
ANION GAP: 13 (ref 5–15)
BUN: 32 mg/dL — ABNORMAL HIGH (ref 6–23)
CALCIUM: 8.8 mg/dL (ref 8.4–10.5)
CO2: 24 mEq/L (ref 19–32)
Chloride: 110 mEq/L (ref 96–112)
Creatinine, Ser: 0.61 mg/dL (ref 0.50–1.10)
GFR, EST NON AFRICAN AMERICAN: 79 mL/min — AB (ref >90)
Glucose, Bld: 153 mg/dL — ABNORMAL HIGH (ref 70–99)
POTASSIUM: 3.8 meq/L (ref 3.7–5.3)
Sodium: 147 mEq/L (ref 137–147)

## 2013-10-02 LAB — CBC
HCT: 32.7 % — ABNORMAL LOW (ref 36.0–46.0)
HEMOGLOBIN: 11.3 g/dL — AB (ref 12.0–15.0)
MCH: 30.5 pg (ref 26.0–34.0)
MCHC: 34.6 g/dL (ref 30.0–36.0)
MCV: 88.1 fL (ref 78.0–100.0)
PLATELETS: 263 10*3/uL (ref 150–400)
RBC: 3.71 MIL/uL — ABNORMAL LOW (ref 3.87–5.11)
RDW: 17.3 % — ABNORMAL HIGH (ref 11.5–15.5)
WBC: 8.3 10*3/uL (ref 4.0–10.5)

## 2013-10-02 LAB — OCCULT BLOOD X 1 CARD TO LAB, STOOL: Fecal Occult Bld: NEGATIVE

## 2013-10-02 MED ORDER — POTASSIUM CHLORIDE CRYS ER 20 MEQ PO TBCR
40.0000 meq | EXTENDED_RELEASE_TABLET | ORAL | Status: AC
  Administered 2013-10-02: 40 meq via ORAL
  Filled 2013-10-02: qty 2

## 2013-10-02 MED ORDER — LISINOPRIL 20 MG PO TABS
20.0000 mg | ORAL_TABLET | Freq: Two times a day (BID) | ORAL | Status: DC
  Administered 2013-10-02 – 2013-10-11 (×15): 20 mg
  Filled 2013-10-02 (×14): qty 1

## 2013-10-02 MED ORDER — PANTOPRAZOLE SODIUM 40 MG IV SOLR
40.0000 mg | Freq: Two times a day (BID) | INTRAVENOUS | Status: DC
  Administered 2013-10-02 – 2013-10-03 (×3): 40 mg via INTRAVENOUS
  Filled 2013-10-02 (×4): qty 40

## 2013-10-02 MED ORDER — HYDRALAZINE HCL 50 MG PO TABS
50.0000 mg | ORAL_TABLET | Freq: Three times a day (TID) | ORAL | Status: DC
  Administered 2013-10-02 – 2013-10-03 (×4): 50 mg via ORAL
  Filled 2013-10-02 (×7): qty 1

## 2013-10-02 NOTE — Plan of Care (Signed)
Had long discussion with pt son Sharlot GowdaGregg and daughter in law, they would like to talk to another son Bonne Doloresricy today to make final decision. But currently, they request that pt continue to be full code and will let us know tomorrow regarding final status. They would like pt to be DNR and DNI but they have to discuss with tricy to make that decisioin. At meantime, they would pt to have PEG tube and later send back to NH. But again, they need to talk to another son and get back to us.  Marvel PlanJindong Bonnie Roig, MD PhD Stroke Neurology 10/02/2013 2:53 PM

## 2013-10-02 NOTE — Progress Notes (Signed)
Stroke Team Progress Note  HISTORY Victoria West is an 80 y.o. female with a history of hypertension, hypothyroidism, glaucoma and dementia who was brought to the emergency room after being found unable to speak as well as unable to move her right side and gaze to the left side. There is no previous history of stroke. CT scan of her head showed no acute intracranial abnormality. She was last seen well at 1700 today. She was beyond time window for intervention with TPA when she arrived in the emergency room. NIH stroke score was 24.   LSN: [id number] on 09/25/2013  tPA Given: No: Beyond time under for treatment consideration  MRankin: 4  S/P Left common carotid arteriogram,followed by mechanical thrombolysis of occluded inf division of LT MCA using a microguidewire - Dr Corliss Skains 09/26/2013 1:11 AM  SUBJECTIVE No family member in room during round. Pt this am Hb increased to 11 after two units blood transfusion yesterday. She was more awake today but still lethargic and not able to follow any commands. Mumbling around with intelligible words. BP still high, added hydralazine PO. Yesterday, US showed big hematoma at right abdominal wall. Will do CT head and abdomen today.   OBJECTIVE Most recent Vital Signs: Filed Vitals:   10/02/13 0755 10/02/13 0800 10/02/13 0900 10/02/13 1100  BP: 155/67 143/65 174/70 177/75  Pulse: 75 72 70 74  Temp: 97.9 F (36.6 C)     TempSrc: Oral     Resp: 16 16 17 21   Height:      Weight:      SpO2: 100% 100% 100% 100%   CBG (last 3)   Recent Labs  10/01/13 2339 10/02/13 0324 10/02/13 0752  GLUCAP 160* 159* 164*    IV Fluid Intake:   . sodium chloride 10 mL/hr at 10/01/13 1900  . niCARDipine 5 mg/hr (10/02/13 0648)  . pantoprozole (PROTONIX) infusion 8 mg/hr (10/01/13 1900)    MEDICATIONS  . sodium chloride   Intravenous Once  . amLODipine  10 mg Per Tube Daily  . antiseptic oral rinse  7 mL Mouth Rinse QID  . bacitracin   Topical Daily  . carvedilol   6.25 mg Per Tube BID WC  . chlorhexidine  15 mL Mouth Rinse BID  . feeding supplement (VITAL HIGH PROTEIN)  1,000 mL Per Tube Q24H  . free water  100 mL Per Tube 3 times per day  . hydrALAZINE  50 mg Oral 3 times per day  . insulin aspart  0-15 Units Subcutaneous 6 times per day  . lisinopril  20 mg Per Tube BID   PRN:  acetaminophen (TYLENOL) oral liquid 160 mg/5 mL, albuterol, hydrALAZINE, metoprolol, ondansetron (ZOFRAN) IV  Diet:    no liquids Activity:  Bedrest DVT Prophylaxis:  Heparin subq.   CLINICALLY SIGNIFICANT STUDIES Basic Metabolic Panel:   Recent Labs Lab 10/01/13 0600 10/02/13 0614  NA 147 147  K 4.8 3.8  CL 114* 110  CO2 26 24  GLUCOSE 130* 153*  BUN 31* 32*  CREATININE 0.72 0.61  CALCIUM 8.3* 8.8   Liver Function Tests:   Recent Labs Lab 09/26/13 0330 09/27/13 0459  AST 7 11  ALT <5 <5  ALKPHOS 60 53  BILITOT <0.2* 0.3  PROT 4.1* 4.6*  ALBUMIN 2.0* 2.4*   CBC:  Recent Labs Lab 09/26/13 0209  09/27/13 0459  10/01/13 1825 10/02/13 0614  WBC 3.3*  < > 11.7*  < > 7.2 8.3  NEUTROABS 2.2  --  10.1*  --   --   --  HGB 7.7*  < > 7.5*  < > 9.6* 11.3*  HCT 24.0*  < > 21.4*  < > 28.3* 32.7*  MCV 100.4*  < > 89.5  < > 88.2 88.1  PLT 241  < > 143*  < > 207 263  < > = values in this interval not displayed. Coagulation:   Recent Labs Lab 09/25/13 2225 09/26/13 0209 09/26/13 0630 09/27/13 0459  LABPROT 13.6 17.1* 18.5* 15.9*  INR 1.04 1.39 1.54* 1.27   Cardiac Enzymes:   Recent Labs Lab 09/26/13 0330 09/26/13 1210 09/26/13 2158 09/27/13 0459  CKTOTAL 46 97  --   --   CKMB  --  4.9*  --   --   TROPONINI  --  0.45* 0.50* 0.45*   Urinalysis: No results found for this basename: COLORURINE, APPERANCEUR, LABSPEC, PHURINE, GLUCOSEU, HGBUR, BILIRUBINUR, KETONESUR, PROTEINUR, UROBILINOGEN, NITRITE, LEUKOCYTESUR,  in the last 168 hours Lipid Panel    Component Value Date/Time   CHOL 136 09/26/2013 0330   TRIG 102 09/26/2013 0330   HDL 26*  09/26/2013 0330   CHOLHDL 5.2 09/26/2013 0330   VLDL 20 09/26/2013 0330   LDLCALC 90 09/26/2013 0330   HgbA1C  Lab Results  Component Value Date   HGBA1C 6.5* 09/26/2013    Urine Drug Screen:   No results found for this basename: labopia,  cocainscrnur,  labbenz,  amphetmu,  thcu,  labbarb    Alcohol Level: No results found for this basename: ETH,  in the last 168 hours  Occult blood in stool - negative.  Ct Head Wo Contrast 09/26/2013    No acute intracranial abnormality. Atrophy, chronic small vessel disease.     Ct Head (brain) Wo Contrast 09/25/2013    1. No acute intracranial pathology seen on CT.  2. Mild to moderate cortical volume loss and diffuse small vessel ischemic microangiopathy.    Dg Chest Port 1 View 09/26/2013    1. Endotracheal tube seen ending 3-4 cm above the carina.  2. Left subclavian line seen ending about the distal SVC.  3. Enteric tube noted extending below the diaphragm, though the side port is seen ending about the distal esophagus.  4. No acute cardiopulmonary process seen; borderline cardiomegaly noted.    Ct Head Wo Contrast   09/27/2013   IMPRESSION: Tube and catheter positions as described without pneumothorax. Left base atelectasis. Elsewhere lungs clear.     09/29/2013 IMPRESSION: Increasing opacity at the right lung base consistent with atelectasis versus developing pneumonia.   MRI of the brain  IMPRESSION:  Acute infarct in the posterior portion of the left MCA territory.  Small area of acute infarct in the right medial occipital lobe.  Severe stenosis distal left M1 segment with occlusion of the  posterior segment of the left middle cerebral artery corresponding  to acute infarct. Decreased flow in the anterior division of the  left middle cerebral artery.  Carotid Doppler  Bilateral: 1-39% ICA stenosis. Vertebral artery flow is antegrade.  2D Echocardiogram  - LVEF 70-75%, severe concentric LVH, trileaflet calcified aortic valve with  calcified mass on non-coronary cusp and calcification of the tip of the left coronary cusp, no significant stenosis, trivial AI, moderate TR, RVSP 53 (moderate pulmonary hypertension), diastolic dysfunction with indeterminate, but likely elevated LV Filling pressure.  EKG  Sinus or ectopic atrial rhythm rate 55 beats per minute.  For complete results please see formal report.   [place] left LE - Large subcutaneous collection compatible with hematoma, tracking  from the left groin up the left abdominal wall. No active hemorrhage  identified on Doppler interrogation  Therapy Recommendations pending    Physical exam  Temp:  [97.9 F (36.6 C)-98.9 F (37.2 C)] 97.9 F (36.6 C) (08/22 0755) Pulse Rate:  [53-84] 74 (08/22 1100) Resp:  [12-22] 21 (08/22 1100) BP: (136-206)/(45-100) 177/75 mmHg (08/22 1100) SpO2:  [100 %] 100 % (08/22 1100) Weight:  [116 lb 6.5 oz (52.8 kg)] 116 lb 6.5 oz (52.8 kg) (08/22 0500)  General - thin, well developed, eyes closed.  Ophthalmologic - not cooperative on exam.  Cardiovascular - Regular rate and rhythm with no murmur.  Respiratory - bilateral coarse rhonchi and decreased breath sounds.  Extremities - right LE BKA, Left LE wound at left shin and left groin hematoma much improved with left flank ecchymosis.  NEUROLOGIC:  More awake alert today, open eyes spontaneously, mumbles words but intelligible, did not follow any commands. PERRL, sluggish to light reaction, left gaze preference and right side neglect, positive corneal and gag, left UE spontaneously movement, but right UE 2/5 on pain stimulation, b/l LEs withdraw to pain and against gravity. Left babinski negative, right BKA. Reflex 1+ throughout.  ASSESSMENT Ms. Victoria West is a 80 y.o. female presenting with aphasia and right hemiparesis . TPA was not administered secondary to late presentation. Status post left common carotid arteriogram, followed by mechanical thrombolysis of occluded inf  division of LT MCA using a microguidewire - Dr Corliss Skains 09/26/2013 1:11 AM. Head CT revealed no acute findings. MRI confirmed left MCA stroke and M2 cut off. Likely extubate today. On aspirin 81 mg orally every day prior to admission. Now on no antithrombotics or anticoagulation for secondary stroke prevention secondary to left groin hematoma and subsequent anemia. Patient with resultant right hemiplegia.    Hyperlipidemia  - Cholesterol 136; LDL 90 - Pravachol prior to admission, resume after po assess  Hypertension history  Dementia  Intubated on ventilator - extubated  Diabetes mellitus - hemoglobin A1c 6.5  Renal insufficiency  Low calcium level, resolved  Right BKA  Anemia - due to groin hematoma and later GIB, requiring blood transfusion.  PLAN Stroke - MRI confirmed left MCA stroke with M2 cut off.  - will repeat CT today to evaluate midline shift. - discontinued ASA due to low Hb.  - hold off heparin for DVT prophylaxis due to dropping Hb - SCDs not ordered secondary to right BKA and no palpable pulse left lower extremity. - Anemia - Hb increased to 11 after 2 PRBC transfusion. Continue to monitor  - Hypokalemia - resolved, this am 3.8. Will give oral supplement - palliative care on board - had discussion with son over the phone today, informed him about diagnosis, treatment options, and possible prognosis. Family will discuss with state status. Currently pt full code.  Respiratory failure - remains extubated - If pt not able to protect airway, will re-intubate as per current plan.  - ABG largely WNL 09/29/13 - CXR unchanged stable - CCM on board  Black stool - black stool this am again - Hb up to 11 this am after transfusion - stool guaiac neg x 2 - d/c protonix drip, put on protonix 40mg  bid   HTN - initially hypotension on dopamine, later cardene drip for HTN - still on cardene drip due to HTN - on lisinopril, amlodipine, coreg - add hydralazine for BP  control - close monitor   Left Groin hematoma - Hematoma and left femoral artery puncture site,  requiring transfusion - hematoma resided  - no active bleeding - US showed large hematoma tracking to abdominal wall - will do CT abd to rule out other source of bleeding such as retroperitoneal  Anemia - 2 units PRBC transfusion yesterday - repeat CBC stable and Hb up to 11 today - close monitor  This patient is critically ill due to left MCA stroke and respiratory failure with numerous comorbidities and at significant risk of neurological worsening, death form cerebral edema, respiratory failure needing reintubation, anemia, bleeding, and infection. This patient's care requires constant monitoring of vital signs, hemodynamics, respiratory and cardiac monitoring, review of multiple databases, neurological assessment, discussion with family, other specialists and medical decision making of high complexity. I spent 50 minutes of neurocritical care time in the care of this patient.  Marvel PlanJindong Kailon Treese, MD PhD Stroke Neurology 10/02/2013 12:04 PM    To contact Stroke Continuity provider, please refer to [email or web address]. After hours, contact General Neurology

## 2013-10-03 ENCOUNTER — Inpatient Hospital Stay (HOSPITAL_COMMUNITY): Payer: Medicare Other

## 2013-10-03 LAB — CBC
HEMATOCRIT: 32.2 % — AB (ref 36.0–46.0)
HEMOGLOBIN: 11.2 g/dL — AB (ref 12.0–15.0)
MCH: 30.8 pg (ref 26.0–34.0)
MCHC: 34.8 g/dL (ref 30.0–36.0)
MCV: 88.5 fL (ref 78.0–100.0)
PLATELETS: 248 10*3/uL (ref 150–400)
RBC: 3.64 MIL/uL — AB (ref 3.87–5.11)
RDW: 17.2 % — ABNORMAL HIGH (ref 11.5–15.5)
WBC: 9.7 10*3/uL (ref 4.0–10.5)

## 2013-10-03 LAB — BASIC METABOLIC PANEL
ANION GAP: 9 (ref 5–15)
BUN: 31 mg/dL — ABNORMAL HIGH (ref 6–23)
CHLORIDE: 111 meq/L (ref 96–112)
CO2: 25 meq/L (ref 19–32)
Calcium: 8.7 mg/dL (ref 8.4–10.5)
Creatinine, Ser: 0.6 mg/dL (ref 0.50–1.10)
GFR calc Af Amer: >90 mL/min (ref >90)
GFR calc non Af Amer: 80 mL/min — ABNORMAL LOW (ref >90)
Glucose, Bld: 198 mg/dL — ABNORMAL HIGH (ref 70–99)
Potassium: 4.4 mEq/L (ref 3.7–5.3)
Sodium: 145 mEq/L (ref 137–147)

## 2013-10-03 LAB — GLUCOSE, CAPILLARY
GLUCOSE-CAPILLARY: 167 mg/dL — AB (ref 70–99)
GLUCOSE-CAPILLARY: 140 mg/dL — AB (ref 70–99)
GLUCOSE-CAPILLARY: 168 mg/dL — AB (ref 70–99)
Glucose-Capillary: 182 mg/dL — ABNORMAL HIGH (ref 70–99)
Glucose-Capillary: 159 mg/dL — ABNORMAL HIGH (ref 70–99)

## 2013-10-03 MED ORDER — PANTOPRAZOLE SODIUM 40 MG PO TBEC
40.0000 mg | DELAYED_RELEASE_TABLET | Freq: Every day | ORAL | Status: DC
  Administered 2013-10-04 – 2013-10-05 (×2): 40 mg via ORAL
  Filled 2013-10-03 (×3): qty 1

## 2013-10-03 MED ORDER — HYDRALAZINE HCL 50 MG PO TABS
100.0000 mg | ORAL_TABLET | Freq: Three times a day (TID) | ORAL | Status: DC
  Administered 2013-10-03 – 2013-10-11 (×18): 100 mg via ORAL
  Filled 2013-10-03 (×21): qty 2

## 2013-10-03 NOTE — Plan of Care (Signed)
Talked with pt sons Sharlot Gowda and Bonne Dolores, as well as daughters-in-law. They are in unanimous decision that they want pt to be in DNR status. I agree.  They, however, want to continue current active treatment and especially continue the tube feeding. They request G-tube placement whenever appropriate and the NH placement. Will consult IR for G-tube placement Monday.  Marvel Plan, MD PhD Stroke Neurology 10/03/2013 2:45 PM

## 2013-10-03 NOTE — Progress Notes (Addendum)
Stroke Team Progress Note  HISTORY Victoria West is an 80 y.o. female with a history of hypertension, hypothyroidism, glaucoma and dementia who was brought to the emergency room after being found unable to speak as well as unable to move her right side and gaze to the left side. There is no previous history of stroke. CT scan of her head showed no acute intracranial abnormality. She was last seen well at 1700 today. She was beyond time window for intervention with TPA when she arrived in the emergency room. NIH stroke score was 24.   LSN: 1700 on 09/25/2013  tPA Given: No: Beyond time under for treatment consideration  MRankin: 4  S/P Left common carotid arteriogram,followed by mechanical thrombolysis of occluded inf division of LT MCA using a microguidewire - Dr Corliss Skains 09/26/2013 1:11 AM  SUBJECTIVE No family member in room during round. Pt this am Hb stable at 11.2. Her mental status did not change from yesterday. Still lethargic and not able to follow any commands. Mumbling around with intelligible words. BP still high, increased hydralazine dose. Yesterday, CT showed big hematoma at right abdominal wall. Talked with son and daughter in law, still full code now. Will plan for G-tube next week.   OBJECTIVE Most recent Vital Signs: Filed Vitals:   10/03/13 0930 10/03/13 1000 10/03/13 1100 10/03/13 1153  BP: 164/69 179/60 177/55   Pulse: 71 57 58   Temp:    97.3 F (36.3 C)  TempSrc:    Axillary  Resp: Height:      Weight:      SpO2: 100% 100% 100%    CBG (last 3)   Recent Labs  10/03/13 0254 10/03/13 0743 10/03/13 1151  GLUCAP 167* 182* 140*    IV Fluid Intake:   . sodium chloride 10 mL/hr at 10/01/13 1900  . niCARDipine 5 mg/hr (10/02/13 1610)    MEDICATIONS  . amLODipine  10 mg Per Tube Daily  . antiseptic oral rinse  7 mL Mouth Rinse QID  . bacitracin   Topical Daily  . carvedilol  6.25 mg Per Tube BID WC  . chlorhexidine  15 mL Mouth Rinse BID  .  feeding supplement (VITAL HIGH PROTEIN)  1,000 mL Per Tube Q24H  . free water  100 mL Per Tube 3 times per day  . hydrALAZINE  50 mg Oral 3 times per day  . insulin aspart  0-15 Units Subcutaneous 6 times per day  . lisinopril  20 mg Per Tube BID  . pantoprazole (PROTONIX) IV  40 mg Intravenous Q12H   PRN:  acetaminophen (TYLENOL) oral liquid 160 mg/5 mL, albuterol, hydrALAZINE, metoprolol, ondansetron (ZOFRAN) IV  Diet:    no liquids Activity:  Bedrest DVT Prophylaxis:  Heparin subq.   CLINICALLY SIGNIFICANT STUDIES Basic Metabolic Panel:   Recent Labs Lab 10/02/13 0614 10/03/13 0245  NA 147 145  K 3.8 4.4  CL 110 111  CO2 24 25  GLUCOSE 153* 198*  BUN 32* 31*  CREATININE 0.61 0.60  CALCIUM 8.8 8.7   Liver Function Tests:   Recent Labs Lab 09/27/13 0459  AST 11  ALT <5  ALKPHOS 53  BILITOT 0.3  PROT 4.6*  ALBUMIN 2.4*   CBC:  Recent Labs Lab 09/27/13 0459  10/02/13 0614 10/03/13 0245  WBC 11.7*  < > 8.3 9.7  NEUTROABS 10.1*  --   --   --   HGB 7.5*  < > 11.3* 11.2*  HCT 21.4*  < >  32.7* 32.2*  MCV 89.5  < > 88.1 88.5  PLT 143*  < > 263 248  < > = values in this interval not displayed. Coagulation:   Recent Labs Lab 09/27/13 0459  LABPROT 15.9*  INR 1.27   Cardiac Enzymes:   Recent Labs Lab 09/26/13 2158 09/27/13 0459  TROPONINI 0.50* 0.45*   Urinalysis: No results found for this basename: COLORURINE, APPERANCEUR, LABSPEC, PHURINE, GLUCOSEU, HGBUR, BILIRUBINUR, KETONESUR, PROTEINUR, UROBILINOGEN, NITRITE, LEUKOCYTESUR,  in the last 168 hours Lipid Panel    Component Value Date/Time   CHOL 136 09/26/2013 0330   TRIG 102 09/26/2013 0330   HDL 26* 09/26/2013 0330   CHOLHDL 5.2 09/26/2013 0330   VLDL 20 09/26/2013 0330   LDLCALC 90 09/26/2013 0330   HgbA1C  Lab Results  Component Value Date   HGBA1C 6.5* 09/26/2013    Urine Drug Screen:   No results found for this basename: labopia,  cocainscrnur,  labbenz,  amphetmu,  thcu,  labbarb     Alcohol Level: No results found for this basename: ETH,  in the last 168 hours  Occult blood in stool - negative.  Ct Head Wo Contrast 09/26/2013    No acute intracranial abnormality. Atrophy, chronic small vessel disease.     10/02/13 Typical evolution of left posterior MCA infarct without hemorrhage.  No new findings.   Ct Head (brain) Wo Contrast 09/25/2013    1. No acute intracranial pathology seen on CT.  2. Mild to moderate cortical volume loss and diffuse small vessel ischemic microangiopathy.    Dg Chest Port 1 View 09/26/2013    1. Endotracheal tube seen ending 3-4 cm above the carina.  2. Left subclavian line seen ending about the distal SVC.  3. Enteric tube noted extending below the diaphragm, though the side port is seen ending about the distal esophagus.  4. No acute cardiopulmonary process seen; borderline cardiomegaly noted.    Ct Head Wo Contrast   09/27/2013   IMPRESSION: Tube and catheter positions as described without pneumothorax. Left base atelectasis. Elsewhere lungs clear.     09/29/2013 IMPRESSION: Increasing opacity at the right lung base consistent with atelectasis versus developing pneumonia.   MRI of the brain  IMPRESSION:  Acute infarct in the posterior portion of the left MCA territory.  Small area of acute infarct in the right medial occipital lobe.  Severe stenosis distal left M1 segment with occlusion of the  posterior segment of the left middle cerebral artery corresponding  to acute infarct. Decreased flow in the anterior division of the  left middle cerebral artery.  Carotid Doppler  Bilateral: 1-39% ICA stenosis. Vertebral artery flow is antegrade.  2D Echocardiogram  - LVEF 70-75%, severe concentric LVH, trileaflet calcified aortic valve with calcified mass on non-coronary cusp and calcification of the tip of the left coronary cusp, no significant stenosis, trivial AI, moderate TR, RVSP 53 (moderate pulmonary hypertension), diastolic  dysfunction with indeterminate, but likely elevated LV Filling pressure.  EKG  Sinus or ectopic atrial rhythm rate 55 beats per minute.  For complete results please see formal report.   [place] left LE - Large subcutaneous collection compatible with hematoma, tracking  from the left groin up the left abdominal wall. No active hemorrhage  identified on Doppler interrogation  CT abdomen - Mild bilateral pleural effusions are noted with adjacent  subsegmental atelectasis.  Mild to moderate anasarca is noted in the subcutaneous tissues of  the abdomen and pelvis.  Large hematoma seen in the  left anterior abdominal wall. Also noted  is 4.3 cm hyperdensity in the pelvis anterior to the uterus  concerning for blood clot. Alternatively, it potentially may  represent mass. Further evaluation with ultrasound or MRI is  recommended.   Therapy Recommendations pending    Physical exam  Temp:  [97.3 F (36.3 C)-98.5 F (36.9 C)] 97.3 F (36.3 C) (08/23 1153) Pulse Rate:  [51-90] 58 (08/23 1100) Resp:  [12-22] 18 (08/23 1100) BP: (134-195)/(45-100) 177/55 mmHg (08/23 1100) SpO2:  [100 %] 100 % (08/23 1100) Weight:  [110 lb 7.2 oz (50.1 kg)] 110 lb 7.2 oz (50.1 kg) (08/23 0500)  General - thin, well developed, eyes closed.  Ophthalmologic - not cooperative on exam.  Cardiovascular - Regular rate and rhythm with no murmur.  Respiratory - bilateral coarse rhonchi and decreased breath sounds.  Extremities - right LE BKA, Left LE wound at left shin and left groin hematoma much improved with left flank ecchymosis.   NEUROLOGIC:  lethargic, open eyes on voice, mumbles words but intelligible, did not follow any commands. PERRL, sluggish to light reaction, left gaze and right side neglect, positive corneal and gag, left UE spontaneously movement, but right UE 1-2/5 on pain stimulation, b/l LEs withdraw to pain and against gravity. Left babinski negative, right BKA. Reflex 1+  throughout.  ASSESSMENT Victoria West is a 80 y.o. female presenting with aphasia and right hemiparesis . TPA was not administered secondary to late presentation. Status post left common carotid arteriogram, followed by mechanical thrombolysis of occluded inf division of LT MCA using a microguidewire - Dr Corliss Skains 09/26/2013 1:11 AM. Head CT revealed no acute findings. MRI confirmed left MCA stroke and M2 cut off. Likely extubate today. On aspirin 81 mg orally every day prior to admission. Now on no antithrombotics or anticoagulation for secondary stroke prevention secondary to left groin hematoma and subsequent anemia. Patient with resultant right hemiplegia.    Hyperlipidemia  - Cholesterol 136; LDL 90 - Pravachol prior to admission, resume after po assess  Hypertension history  Dementia  Intubated on ventilator - extubated  Diabetes mellitus - hemoglobin A1c 6.5  Renal insufficiency  Low calcium level, resolved  Right BKA  Anemia - due to groin hematoma and later GIB, requiring blood transfusion.  PLAN Stroke - MRI confirmed left MCA stroke with M2 cut off.  - repeat CT yesterday showed evolving left MCA stroke, no midline shift. - discontinued ASA due to low Hb requiring transfusion. May consider to resume once Hb stable. - discontinued heparin subq for DVT prophylaxis due to dropping Hb requiring transfusion. May consider to resume once hb stable. - SCDs not ordered secondary to right BKA and no palpable pulse left lower extremity. - Anemia - Hb stable at 11 after 2 PRBC transfusion. Continue to monitor  - Hypokalemia - resolved, this am 4.4. Will give oral supplement - palliative care on board - had discussion with son yesterday, informed him about diagnosis, treatment options, and possible prognosis. Family will discuss with state status. Currently pt full code, and family wish for G tube and NH placement.  Respiratory failure - remains extubated - If pt not able to  protect airway, will re-intubate as per current plan.  - ABG largely WNL 09/29/13 - CXR unchanged stable, will repeat today - CCM on board  Black stool - black stool this am again - Hb stable at 11.2 - stool guaiac neg x 2 - d/c protonix drip, put on protonix  bid  HTN - initially hypotension on dopamine, later cardene drip for HTN - off cardene drip - on lisinopril, amlodipine, coreg, hydralazine - inicrease hydralazine dose for BP control - close monitor   Left Groin hematoma - Hematoma and left femoral artery puncture site, requiring transfusion - hematoma resided  - no active bleeding - [place] and CT showed large hematoma tracking to abdominal wall - close monitor H&H  Anemia - 2 units PRBC transfusion yesterday - repeat CBC stable and Hb up to 11.2 today - close monitor  This patient is critically ill due to left MCA stroke and respiratory failure with numerous comorbidities and at significant risk of neurological worsening, death form cerebral edema, respiratory failure needing reintubation, anemia, bleeding, and infection. This patient's care requires constant monitoring of vital signs, hemodynamics, respiratory and cardiac monitoring, review of multiple databases, neurological assessment, discussion with family, other specialists and medical decision making of high complexity. I spent 35 minutes of neurocritical care time in the care of this patient.  Marvel Plan, MD PhD Stroke Neurology 10/03/2013 12:31 PM    To contact Stroke Continuity provider, please refer to [email or web address]. After hours, contact General Neurology

## 2013-10-04 DIAGNOSIS — L97909 Non-pressure chronic ulcer of unspecified part of unspecified lower leg with unspecified severity: Secondary | ICD-10-CM

## 2013-10-04 LAB — GLUCOSE, CAPILLARY
GLUCOSE-CAPILLARY: 124 mg/dL — AB (ref 70–99)
Glucose-Capillary: 122 mg/dL — ABNORMAL HIGH (ref 70–99)
Glucose-Capillary: 131 mg/dL — ABNORMAL HIGH (ref 70–99)
Glucose-Capillary: 139 mg/dL — ABNORMAL HIGH (ref 70–99)
Glucose-Capillary: 154 mg/dL — ABNORMAL HIGH (ref 70–99)
Glucose-Capillary: 162 mg/dL — ABNORMAL HIGH (ref 70–99)
Glucose-Capillary: 189 mg/dL — ABNORMAL HIGH (ref 70–99)

## 2013-10-04 LAB — BASIC METABOLIC PANEL
Anion gap: 10 (ref 5–15)
BUN: 37 mg/dL — AB (ref 6–23)
CO2: 24 meq/L (ref 19–32)
Calcium: 8.6 mg/dL (ref 8.4–10.5)
Chloride: 110 mEq/L (ref 96–112)
Creatinine, Ser: 0.71 mg/dL (ref 0.50–1.10)
GFR calc Af Amer: 87 mL/min — ABNORMAL LOW (ref >90)
GFR calc non Af Amer: 75 mL/min — ABNORMAL LOW (ref >90)
GLUCOSE: 153 mg/dL — AB (ref 70–99)
Potassium: 4.4 mEq/L (ref 3.7–5.3)
Sodium: 144 mEq/L (ref 137–147)

## 2013-10-04 LAB — CBC
HEMATOCRIT: 31.5 % — AB (ref 36.0–46.0)
Hemoglobin: 10.4 g/dL — ABNORMAL LOW (ref 12.0–15.0)
MCH: 30.1 pg (ref 26.0–34.0)
MCHC: 33 g/dL (ref 30.0–36.0)
MCV: 91 fL (ref 78.0–100.0)
Platelets: 292 10*3/uL (ref 150–400)
RBC: 3.46 MIL/uL — ABNORMAL LOW (ref 3.87–5.11)
RDW: 17.4 % — ABNORMAL HIGH (ref 11.5–15.5)
WBC: 9.1 10*3/uL (ref 4.0–10.5)

## 2013-10-04 NOTE — Clinical Social Work Note (Signed)
Clinical Social Worker continuing to follow patient and family for support and discharge planning needs.  Per RN, patient family has opted for PEG placement.  Patient awaiting PEG placement and then will likely return to [facility].  CSW left a message for [facility] admissions to confirm patient return - await return call.  CSW remains available for support and to facilitate patient discharge needs once medically ready.  Macario Golds, [place] [phone]

## 2013-10-04 NOTE — Progress Notes (Signed)
Occupational Therapy Evaluation Patient Details Name: Victoria West MRN: [id number] DOB: [birth year] Today's Date: 10/04/2013    History of Present Illness 80 yo resident of [facility] with R BKA who suffered L MCA CVA   Clinical Impression   PTA, pt lived at SNF and was apparently able to push herself in her w/c and assist with her ADL. Pt currently +2 total A for all mobility and ADL and will need SNF for rehab. Nsg will need to use maximove or sky lift to mobilize pt. Pt opened eyes when sitting EOB and began crying. When asked if she was in pain, pt shaking head "yes" and mumbling unintelligibly. Pt shaking head "yes" to all questions asked.  Nsg aware. OT signing off.     Follow Up Recommendations  SNF;Supervision/Assistance - 24 hour    Equipment Recommendations  None recommended by OT    Recommendations for Other Services       Precautions / Restrictions Precautions Precautions: Fall Precaution Comments: at risk for skin breakdown      Mobility Bed Mobility Overal bed mobility: Needs Assistance;+2 for physical assistance Bed Mobility: Supine to Sit;Sit to Supine     Supine to sit: Total assist;+2 for physical assistance Sit to supine: Total assist;+2 for physical assistance      Transfers                 General transfer comment: bed level only    Balance Overall balance assessment: Needs assistance Sitting-balance support: Single extremity supported (foot supported) Sitting balance-Leahy Scale: Zero   Postural control: Posterior lean;Right lateral lean;Left lateral lean                                  ADL Overall ADL's : Needs assistance/impaired Eating/Feeding: NPO                                   Functional mobility during ADLs:  (+2 total A for bed mobility) General ADL Comments: total A with all ADL; unable to wash face on command; closing eyes;     Vision                 Additional Comments:  unsure   Perception     Praxis Praxis Praxis tested?: Deficits Praxis-Other Comments: apraxia vs receptive aphasia    Pertinent Vitals/Pain Pain Assessment: Faces Pain Score: 6  Faces Pain Scale: Hurts even more Pain Intervention(s): Repositioned;Monitored during session     Hand Dominance     Extremity/Trunk Assessment Upper Extremity Assessment Upper Extremity Assessment: RUE deficits/detail RUE Deficits / Details: No active movement observed RUE. Using LUE to prop in sitting RUE Coordination: decreased fine motor;decreased gross motor   Lower Extremity Assessment Lower Extremity Assessment: RLE deficits/detail (RBKA. no active movement observed) LLE Deficits / Details: L foot cold to touch. wound on ant lower leg. not moving LLE on command   Cervical / Trunk Assessment Cervical / Trunk Assessment: Other exceptions Cervical / Trunk Exceptions: unable to hold trunk upright.leaning R/L   Communication Communication Communication: Receptive difficulties;Expressive difficulties   Cognition Arousal/Alertness: Lethargic Behavior During Therapy: Flat affect (began crying in sitting) Overall Cognitive Status: Difficult to assess       Memory:  (does not wash face on command; did not give thumbs up on com)  General Comments       Exercises       Shoulder Instructions      Home Living Family/patient expects to be discharged to:: Skilled nursing facility                                        Prior Functioning/Environment Level of Independence: Needs assistance             OT Diagnosis: Generalized weakness;Cognitive deficits;Acute pain;Hemiplegia dominant side;Apraxia   OT Problem List: Decreased strength;Decreased range of motion;Decreased activity tolerance;Impaired balance (sitting and/or standing);Decreased coordination;Decreased cognition;Decreased safety awareness;Decreased knowledge of use of DME or AE;Decreased knowledge  of precautions;Cardiopulmonary status limiting activity;Impaired sensation;Impaired tone;Impaired UE functional use;Pain;Increased edema   OT Treatment/Interventions:      OT Goals(Current goals can be found in the care plan section) Acute Rehab OT Goals Patient Stated Goal: none stated  OT Frequency:     Barriers to D/C:            Co-evaluation              End of Session Nurse Communication: Mobility status;Need for lift equipment  Activity Tolerance: Patient limited by fatigue Patient left: in bed;with call bell/phone within reach;with nursing/sitter in room   Time: 1478-2956 OT Time Calculation (min): 15 min Charges:  OT General Charges $OT Visit: 1 Procedure OT Evaluation $Initial OT Evaluation Tier I: 1 Procedure OT Treatments $Self Care/Home Management : 8-22 mins G-Codes:    Shaquila Sigman,HILLARY 10-31-13, 4:23 PM   [facility], OTR/L  [phone] 31-Oct-2013 Tashea Othman, OTR/L  [phone] 10/31/13

## 2013-10-04 NOTE — Progress Notes (Signed)
NUTRITION FOLLOW UP  DOCUMENTATION CODES Per approved criteria  -Not Applicable   INTERVENTION: Continue current TF regimen RD to follow for nutrition care plan  NUTRITION DIAGNOSIS: Inadequate oral intake related to inability to eat as evidenced by NPO status, ongoing  Goal: Pt to meet >/= 90% of their estimated nutrition needs, met   Monitor:  TF regimen & tolerance, weight, labs, I/O's  ASSESSMENT: 80 y.o. Female with a history of hypertension, hypothyroidism, glaucoma and dementia who was brought to the ER after being found unable to speak as well as unable to move her right side and gaze to the left side. There is no previous history of stroke. CT scan of her head showed no acute intracranial abnormality. She was last seen well at 1700 today. She was beyond time window for intervention with TPA when she arrived in the emergency room. NIH stroke score was 24.  Patient extubated 8/18.  Vital HP formula currently infusing at 45 ml/hr via Panda tube (placed 8/22, tip in stomach) providing 1080 kcals, 94 gm protein, 903 ml of free water.  Free water flushes at 100 ml every 8 hours.  Patient remains neurologically stable with persistent aphasia and right-sided weakness.  Palliative Care Team continuing to follow.  Noted family has decided to pursue PEG tube placement.  Height: Ht Readings from Last 1 Encounters:  09/26/13 _0  (1.626 m)    Weight: -----> stable Wt Readings from Last 1 Encounters:  10/04/13 114 lb 6.7 oz (51.9 kg)    8/23  110 lb 8/22  116 lb 8/21  116 lb 8/20  119 lb 8/19  121 lb 8/18  122 lb 8/17  121 lb  BMI:  21.7 kg/m2 -- adjusted for BKA  Re-estimated Nutritional Needs: Kcal: 1200-1400 Protein: 80-95 gm Fluid: per MD  Skin:  full thickness left tibial wound  unstageable left heel wound  Diet Order: NPO   Intake/Output Summary (Last 24 hours) at 10/04/13 1437 Last data filed at 10/04/13 1400  Gross per 24 hour  Intake   1530 ml   Output   1300 ml  Net    230 ml    Labs:   Recent Labs Lab 10/02/13 0614 10/03/13 0245 10/04/13 0445  NA 147 145 144  K 3.8 4.4 4.4  CL 110 111 110  CO2 _1 BUN 32* 31* 37*  CREATININE 0.61 0.60 0.71  CALCIUM 8.8 8.7 8.6  GLUCOSE 153* 198* 153*    CBG (last 3)   Recent Labs  10/04/13 0411 10/04/13 0743 10/04/13 1137  GLUCAP 124* 131* 139*    Scheduled Meds: . amLODipine  10 mg Per Tube Daily  . antiseptic oral rinse  7 mL Mouth Rinse QID  . bacitracin   Topical Daily  . carvedilol  6.25 mg Per Tube BID WC  . chlorhexidine  15 mL Mouth Rinse BID  . feeding supplement (VITAL HIGH PROTEIN)  1,000 mL Per Tube Q24H  . free water  100 mL Per Tube 3 times per day  . hydrALAZINE  100 mg Oral 3 times per day  . insulin aspart  0-15 Units Subcutaneous 6 times per day  . lisinopril  20 mg Per Tube BID  . pantoprazole  40 mg Oral Daily    Continuous Infusions: . sodium chloride 10 mL/hr at 10/01/13 1900  . niCARDipine 5 mg/hr (10/02/13 0626)    History reviewed. No pertinent past medical history.  Past Surgical History  Procedure Laterality Date  .  Radiology with anesthesia N/A 09/25/2013    Procedure: RADIOLOGY WITH ANESTHESIA;  Surgeon: Rob Hickman, MD;  Location: [facility];  Service: Radiology;  Laterality: N/A;    Arthur Holms, RD, LDN Pager #: [phone] After-Hours Pager #: [phone]

## 2013-10-04 NOTE — Progress Notes (Signed)
Patient WJ:XBJYN Victoria West      DOB: 1924-09-11      WGN:[id number]   Palliative Medicine Team at [facility] Progress Note    Subjective: [name] awakes to loud voice but falls back to sleep easily.  Noted events of the weekend.  No family at the bedside.  Patient  Now DNR, but family have decided to pursue feeding tube placement.  Will continue to support as best we can. Filed Vitals:   10/04/13 0745  BP:   Pulse:   Temp: 97.2 F (36.2 C)  Resp:    Physical exam:  General:  Does not open eyes when talking but will acknowledge that we care present, mumbles.  Not speaking clearly.  Pupils not examined this am. Chest : decreased but clear CVS: regular, S1, S2 Abd: soft , not tender or distended Ext: still oozing from under bandage Neuro; patient unable to stimulate self to productive activity mainly sleeping     Assessment and plan: 80 yr old with large MCA stroke s/p extubation. Remains with significant cognitive deficits..  Noted topical antibiotics selected by primary service for leg wound.   1.  DNR  Now in place 2. Leg ulcer:  Wound care recommendation parenteral antibiotics, noted topical initiated. Please reassess... Still draining from under dressing and pocket had formed at the time of last exam.  Conveyed to primary team. 3. Dysphagia and debility secondary to large stroke.  Patient for Feeding tube placement.  Total time:  745 am- 800 am  Carrol Hougland L. Ladona Ridgel, MD MBA The Palliative Medicine Team at [facility] Phone: [phone] Pager: [phone] ( Use team phone after hours)

## 2013-10-04 NOTE — Progress Notes (Signed)
Stroke Team Progress Note  HISTORY Victoria West is an 80 y.o. female with a history of hypertension, hypothyroidism, glaucoma and dementia who was brought to the emergency room after being found unable to speak as well as unable to move her right side and gaze to the left side. There is no previous history of stroke. CT scan of her head showed no acute intracranial abnormality. She was last seen well at 1700 today. She was beyond time window for intervention with TPA when she arrived in the emergency room. NIH stroke score was 24.   LSN: [id number] on 09/25/2013  tPA Given: No: Beyond time under for treatment consideration  MRankin: 4  S/P Left common carotid arteriogram,followed by mechanical thrombolysis of occluded inf division of LT MCA using a microguidewire - Dr Corliss Skains 09/26/2013 1:11 AM  SUBJECTIVE Patient remains neurologically stable with persistent aphasia and right-sided weakness. She had pulled out and the tube but it has been replaced. Family is agreeable to PEG tube and nursing home placement  OBJECTIVE Most recent Vital Signs: Filed Vitals:   10/04/13 0600 10/04/13 0745 10/04/13 0800 10/04/13 0856  BP: 172/61  175/57 198/66  Pulse: 57  54 60  Temp:  97.2 F (36.2 C)    TempSrc:  Axillary    Resp: 17  15   Height:      Weight:      SpO2: 100%  100%    CBG (last 3)   Recent Labs  10/03/13 2332 10/04/13 0411 10/04/13 0743  GLUCAP 122* 124* 131*    IV Fluid Intake:   . sodium chloride 10 mL/hr at 10/01/13 1900  . niCARDipine 5 mg/hr (10/02/13 1610)    MEDICATIONS  . amLODipine  10 mg Per Tube Daily  . antiseptic oral rinse  7 mL Mouth Rinse QID  . bacitracin   Topical Daily  . carvedilol  6.25 mg Per Tube BID WC  . chlorhexidine  15 mL Mouth Rinse BID  . feeding supplement (VITAL HIGH PROTEIN)  1,000 mL Per Tube Q24H  . free water  100 mL Per Tube 3 times per day  . hydrALAZINE  100 mg Oral 3 times per day  . insulin aspart  0-15 Units Subcutaneous 6 times per  day  . lisinopril  20 mg Per Tube BID  . pantoprazole  40 mg Oral Daily   PRN:  acetaminophen (TYLENOL) oral liquid 160 mg/5 mL, albuterol, hydrALAZINE, metoprolol, ondansetron (ZOFRAN) IV  Diet:    tube feedings Activity:  Bedrest DVT Prophylaxis:  Heparin subq.   CLINICALLY SIGNIFICANT STUDIES Basic Metabolic Panel:   Recent Labs Lab 10/03/13 0245 10/04/13 0445  NA 145 144  K 4.4 4.4  CL 111 110  CO2 25 24  GLUCOSE 198* 153*  BUN 31* 37*  CREATININE 0.60 0.71  CALCIUM 8.7 8.6   Liver Function Tests:  No results found for this basename: AST, ALT, ALKPHOS, BILITOT, PROT, ALBUMIN,  in the last 168 hours CBC:   Recent Labs Lab 10/03/13 0245 10/04/13 0445  WBC 9.7 9.1  HGB 11.2* 10.4*  HCT 32.2* 31.5*  MCV 88.5 91.0  PLT 248 292   Coagulation:  No results found for this basename: LABPROT, INR,  in the last 168 hours Cardiac Enzymes:  No results found for this basename: CKTOTAL, CKMB, CKMBINDEX, TROPONINI,  in the last 168 hours Urinalysis: No results found for this basename: COLORURINE, APPERANCEUR, LABSPEC, PHURINE, GLUCOSEU, HGBUR, BILIRUBINUR, KETONESUR, PROTEINUR, UROBILINOGEN, NITRITE, LEUKOCYTESUR,  in the last 168  hours Lipid Panel    Component Value Date/Time   CHOL 136 09/26/2013 0330   TRIG 102 09/26/2013 0330   HDL 26* 09/26/2013 0330   CHOLHDL 5.2 09/26/2013 0330   VLDL 20 09/26/2013 0330   LDLCALC 90 09/26/2013 0330   HgbA1C  Lab Results  Component Value Date   HGBA1C 6.5* 09/26/2013    Urine Drug Screen:   No results found for this basename: labopia,  cocainscrnur,  labbenz,  amphetmu,  thcu,  labbarb    Alcohol Level: No results found for this basename: ETH,  in the last 168 hours  Occult blood in stool - negative.  Ct Head Wo Contrast 10/02/13  Typical evolution of left posterior MCA infarct without hemorrhage. No new findings.  09/26/2013    No acute intracranial abnormality. Atrophy, chronic small vessel disease.    09/25/2013     1. No  acute intracranial pathology seen on CT.  2. Mild to moderate cortical volume loss and diffuse small vessel ischemic microangiopathy.    Dg Chest Port 1 View 09/26/2013    1. Endotracheal tube seen ending 3-4 cm above the carina.  2. Left subclavian line seen ending about the distal SVC.  3. Enteric tube noted extending below the diaphragm, though the side port is seen ending about the distal esophagus.  4. No acute cardiopulmonary process seen; borderline cardiomegaly noted.    Ct Head Wo Contrast   09/27/2013   IMPRESSION: Tube and catheter positions as described without pneumothorax. Left base atelectasis. Elsewhere lungs clear.     09/29/2013 IMPRESSION: Increasing opacity at the right lung base consistent with atelectasis versus developing pneumonia.   MRI of the brain  IMPRESSION:  Acute infarct in the posterior portion of the left MCA territory.  Small area of acute infarct in the right medial occipital lobe.  Severe stenosis distal left M1 segment with occlusion of the  posterior segment of the left middle cerebral artery corresponding  to acute infarct. Decreased flow in the anterior division of the  left middle cerebral artery.  Carotid Doppler  Bilateral: 1-39% ICA stenosis. Vertebral artery flow is antegrade.  2D Echocardiogram  - LVEF 70-75%, severe concentric LVH, trileaflet calcified aortic valve with calcified mass on non-coronary cusp and calcification of the tip of the left coronary cusp, no significant stenosis, trivial AI, moderate TR, RVSP 53 (moderate pulmonary hypertension), diastolic dysfunction with indeterminate, but likely elevated LV Filling pressure.  EKG  Sinus or ectopic atrial rhythm rate 55 beats per minute.  For complete results please see formal report.   [place] left LE - Large subcutaneous collection compatible with hematoma, tracking from the left groin up the left abdominal wall. No active hemorrhage identified on Doppler interrogation  CT abdomen - Mild  bilateral pleural effusions are noted with adjacent subsegmental atelectasis. Mild to moderate anasarca is noted in the subcutaneous tissues of the abdomen and pelvis. Large hematoma seen in the left anterior abdominal wall. Also noted is 4.3 cm hyperdensity in the pelvis anterior to the uterus concerning for blood clot. Alternatively, it potentially may represent mass. Further evaluation with ultrasound or MRI is recommended.   Therapy Recommendations  SNF    Physical exam  General - thin, well developed, african [place] lady not in distress.   Ophthalmologic - not cooperative on exam.  Cardiovascular - Regular rate and rhythm with no murmur.  Respiratory - bilateral coarse rhonchi and decreased breath sounds.  Extremities - right LE BKA, Left LE wound at left  shin and left groin hematoma much improved with left flank ecchymosis.   NEUROLOGIC:  lethargic, open eyes on voice, globally aphasic and mumbles words but unintelligible, did not follow any commands except for sticking out her tongue. PERRL, sluggish to light reaction, left gaze and right side neglect, positive corneal and gag, left UE spontaneously movement, but right UE 1-2/5 on pain stimulation, b/l LEs withdraw to pain and against gravity. Left babinski negative, right BKA. Reflex 1+ throughout.  ASSESSMENT Victoria West is a 80 y.o. female presenting with aphasia and right hemiparesis . TPA was not administered secondary to late presentation. Status post left common carotid arteriogram, followed by mechanical thrombolysis of occluded inf division of LT MCA using a microguidewire - Dr Corliss Skains 09/26/2013 1:11 AM. Head CT revealed no acute findings. MRI confirmed left MCA stroke and M2 cut off.  On aspirin 81 mg orally every day prior to admission. Now on no antithrombotics or anticoagulation for secondary stroke prevention secondary to left groin hematoma and subsequent anemia. Patient with resultant right hemiplegia.    Cardiac  arrest, resolved  Hyperlipidemia  - Cholesterol 136; LDL 90 - Pravachol prior to admission, resume after po assess  Hypertension history  Dementia  Intubated on ventilator - extubated  Diabetes mellitus - hemoglobin A1c 6.5  Renal insufficiency  Low calcium level, resolved  Right BKA  Anemia - due to groin hematoma and later GIB, requiring blood transfusion.  PLAN Stroke - MRI confirmed left MCA stroke with M2 cut off.  - repeat CT  showed evolving left MCA stroke, no midline shift. - discontinued ASA due to low Hb requiring transfusion. May consider to resume once Hb stable. - discontinued heparin subq for DVT prophylaxis dropping Hb requiring transfusion. May consider to resume once hb stable. - SCDs not ordered secondary to right BKA and no palpable pulse left lower extremity. - Anemia - Hb stable at 11 after 2 PRBC transfusion. Continue to monitor  - Hypokalemia - resolved, this am 4.4. Will give oral supplement - palliative care on board - had discussion with son today informed him about diagnosis, treatment options, and possible prognosis. Family agree to DO NOT RESUSCITATE status  but  wish for G tube and NH placement.  Respiratory failure - remains extubated - ABG largely WNL 09/29/13 - CXR unchanged stable, will repeat today - CCM on board  Black stool - black stool this am again - Hb stable at 11.2 - stool guaiac neg x 2 - d/c protonix drip, put on protonix  bid   HTN - initially hypotension on dopamine, later cardene drip for HTN - off cardene drip - on lisinopril, amlodipine, coreg, hydralazine - inicrease hydralazine dose for BP control - close monitor   Left Groin hematoma - Hematoma and left femoral artery puncture site, requiring transfusion - hematoma resided  - no active bleeding - [place] and CT showed large hematoma tracking to abdominal wall - close monitor H&H  Anemia - 2 units PRBC transfusion yesterday - repeat CBC stable and Hb up to  11.2 today - close monitor  Pressure Wounds L tibial, L heel, sacral (stage 2)  Delia Heady, MD   To contact Stroke Continuity provider, please refer to [email or web address]. After hours, contact General Neurology

## 2013-10-04 NOTE — Progress Notes (Signed)
Pt's SBP has been consistently > 180.  Tried PRN hydralazine twice resulting in little change.  Dr. Roseanne Reno notified and he said since she is not comfort care we need to aggressively treat her BP.  He said restart Cardene to keep SBP <170.

## 2013-10-05 DIAGNOSIS — I6350 Cerebral infarction due to unspecified occlusion or stenosis of unspecified cerebral artery: Secondary | ICD-10-CM

## 2013-10-05 DIAGNOSIS — E46 Unspecified protein-calorie malnutrition: Secondary | ICD-10-CM

## 2013-10-05 DIAGNOSIS — R1310 Dysphagia, unspecified: Secondary | ICD-10-CM

## 2013-10-05 LAB — CBC
HCT: 32.3 % — ABNORMAL LOW (ref 36.0–46.0)
Hemoglobin: 11 g/dL — ABNORMAL LOW (ref 12.0–15.0)
MCH: 31.1 pg (ref 26.0–34.0)
MCHC: 34.1 g/dL (ref 30.0–36.0)
MCV: 91.2 fL (ref 78.0–100.0)
PLATELETS: 324 10*3/uL (ref 150–400)
RBC: 3.54 MIL/uL — AB (ref 3.87–5.11)
RDW: 17.1 % — ABNORMAL HIGH (ref 11.5–15.5)
WBC: 9.3 10*3/uL (ref 4.0–10.5)

## 2013-10-05 LAB — GLUCOSE, CAPILLARY
GLUCOSE-CAPILLARY: 188 mg/dL — AB (ref 70–99)
GLUCOSE-CAPILLARY: 187 mg/dL — AB (ref 70–99)
GLUCOSE-CAPILLARY: 182 mg/dL — AB (ref 70–99)
GLUCOSE-CAPILLARY: 151 mg/dL — AB (ref 70–99)
Glucose-Capillary: 134 mg/dL — ABNORMAL HIGH (ref 70–99)
Glucose-Capillary: 148 mg/dL — ABNORMAL HIGH (ref 70–99)
Glucose-Capillary: 170 mg/dL — ABNORMAL HIGH (ref 70–99)

## 2013-10-05 LAB — BASIC METABOLIC PANEL
ANION GAP: 11 (ref 5–15)
BUN: 39 mg/dL — ABNORMAL HIGH (ref 6–23)
CHLORIDE: 108 meq/L (ref 96–112)
CO2: 24 meq/L (ref 19–32)
CREATININE: 0.65 mg/dL (ref 0.50–1.10)
Calcium: 8.5 mg/dL (ref 8.4–10.5)
GFR calc Af Amer: 90 mL/min — ABNORMAL LOW (ref >90)
GFR calc non Af Amer: 78 mL/min — ABNORMAL LOW (ref >90)
Glucose, Bld: 202 mg/dL — ABNORMAL HIGH (ref 70–99)
Potassium: 4 mEq/L (ref 3.7–5.3)
Sodium: 143 mEq/L (ref 137–147)

## 2013-10-05 MED ORDER — SODIUM CHLORIDE 0.9 % IV SOLN
INTRAVENOUS | Status: DC
  Administered 2013-10-06 – 2013-10-08 (×6): via INTRAVENOUS
  Administered 2013-10-10: 1000 mL via INTRAVENOUS
  Administered 2013-10-11: 07:00:00 via INTRAVENOUS

## 2013-10-05 NOTE — Evaluation (Signed)
Physical Therapy Evaluation Patient Details Name: Victoria West MRN: [id number] DOB: October 11, 1924 Today's Date: 10/05/2013   History of Present Illness  80 yo resident of [facility] Burn with R BKA who suffered L MCA CVA  Clinical Impression  Patient demonstrates deficits in functional mobility as indicated below. Will need continued skilled PT to address deficits and maximize function. Will see as indicated and progress as tolerated.    Follow Up Recommendations SNF;Supervision/Assistance - 24 hour    Equipment Recommendations  Other (comment) (tbd)    Recommendations for Other Services       Precautions / Restrictions Precautions Precautions: Fall Precaution Comments: at risk for skin breakdown Restrictions Weight Bearing Restrictions: No      Mobility  Bed Mobility Overal bed mobility: Needs Assistance;+2 for physical assistance Bed Mobility: Supine to Sit;Sit to Supine     Supine to sit: Total assist;+2 for physical assistance Sit to supine: Total assist;+2 for physical assistance   General bed mobility comments: patient able to move LLE actively with assist, but required helicopter technique using check pad to rotate to EOb and full trunk support in sitting. patient was attempting to Support with LUE, no active support with RUE seen at this time.   Transfers                 General transfer comment: activity at EOb, limited by elevated BP 180s/100s   Ambulation/Gait                Stairs            Wheelchair Mobility    Modified Rankin (Stroke Patients Only) Modified Rankin (Stroke Patients Only) Pre-Morbid Rankin Score: Moderately severe disability Modified Rankin: Severe disability     Balance Overall balance assessment: Needs assistance Sitting-balance support: Single extremity supported (attempting LUE support) Sitting balance-Leahy Scale: Zero Sitting balance - Comments: Maximal to total support required Postural control: Posterior  lean;Right lateral lean;Left lateral lean                                   Pertinent Vitals/Pain Pain Assessment: Faces Faces Pain Scale: Hurts even more Pain Intervention(s): Repositioned;Monitored during session    Home Living Family/patient expects to be discharged to:: Skilled nursing facility                      Prior Function Level of Independence: Needs assistance               Hand Dominance        Extremity/Trunk Assessment   Upper Extremity Assessment: Defer to OT evaluation           Lower Extremity Assessment: RLE deficits/detail (RBKA. no active movement observed)   LLE Deficits / Details: Wound on Left heel, very stiff with limited ROM tolerated. some active movement noted.  Cervical / Trunk Assessment: Other exceptions  Communication   Communication: Receptive difficulties;Expressive difficulties  Cognition Arousal/Alertness: Lethargic Behavior During Therapy: Flat affect Overall Cognitive Status: Difficult to assess Area of Impairment: Following commands               General Comments: was able to squeeze hands on command, but had difficulty initiating movement and follow other commands consistently throughout session. patient did direct attention to verbal stimuli    General Comments General comments (skin integrity, edema, etc.): Patient sat EOB for >10 minutes, PROM performed LLE, attempted trunk activity,  patient with inability to demonstrate trunk control.     Exercises        Assessment/Plan    PT Assessment Patient needs continued PT services  PT Diagnosis Generalized weakness;Acute pain;Hemiplegia dominant side;Altered mental status   PT Problem List Decreased strength;Decreased range of motion;Decreased activity tolerance;Decreased balance;Decreased mobility;Decreased coordination;Decreased cognition;Decreased safety awareness;Cardiopulmonary status limiting activity;Pain  PT Treatment Interventions  DME instruction;Functional mobility training;Therapeutic activities;Therapeutic exercise;Balance training;Cognitive remediation;Patient/family education   PT Goals (Current goals can be found in the Care Plan section) Acute Rehab PT Goals Patient Stated Goal: none stated PT Goal Formulation: With patient/family Time For Goal Achievement: 10/19/13 Potential to Achieve Goals: Fair    Frequency Min 2X/week   Barriers to discharge        Co-evaluation               End of Session   Activity Tolerance: Patient limited by fatigue;Other (comment) (elevated BP 180s/100s) Patient left: in bed;with call bell/phone within reach;with family/visitor present Nurse Communication: Mobility status         Time: 1191-4782 PT Time Calculation (min): 21 min   Charges:   PT Evaluation $Initial PT Evaluation Tier I: 1 Procedure PT Treatments $Therapeutic Activity: 8-22 mins   PT G CodesFabio Asa 10/05/2013, 10:05 AM Charlotte Crumb, PT DPT  [phone]

## 2013-10-05 NOTE — Consult Note (Signed)
This is an unfortunate 80 year old female who was admitted with symptoms and signs of a left middle cerebral artery occlusion from a nursing home. She was outside the window for TPA and subsequently had mechanical opening of the left MCA followed by anticoagulation.  After the procedure the patient began to bleed from her left groin site leading to subsequent cardiac arrest. She was resuscitated with CPR and fluid administration. She also received several blood products. She is now recovering from these events and has an inability to function in a chronic state requiring full support and nutritional support.  The patient has a past medical history of hypertension and vascular disease. She has had a right below knee amputation, and some type of lower abdominal surgery. Her upper abdomen does not appear to have any surgical scars.  On examination, the patient is arousable but does not seem oriented. She mumbles but not coherently.  Abdomen: Soft, mildly to moderately distended in the upper portion. Excellent bowel sounds. Tolerating tube feedings well.  The patient requires a gastrostomy tube for long-term nutrition and disposition. A percutaneous endoscopic gastrostomy tube will be placed tomorrow. Her tube feedings will be held after midnight. This will be done in the endoscopy suite.  Marta Lamas. Gae Bon, MD, FACS [phone] Trauma Surgeon

## 2013-10-05 NOTE — Progress Notes (Signed)
Stroke Team Progress Note  HISTORY Victoria West is an 80 y.o. female with a history of hypertension, hypothyroidism, glaucoma and dementia who was brought to the emergency room after being found unable to speak as well as unable to move her right side and gaze to the left side. There is no previous history of stroke. CT scan of her head showed no acute intracranial abnormality. She was last seen well at 1700 today. She was beyond time window for intervention with TPA when she arrived in the emergency room. NIH stroke score was 24.   LSN: [id number] on 09/25/2013  tPA Given: No: Beyond time under for treatment consideration  MRankin: 4  S/P Left common carotid arteriogram,followed by mechanical thrombolysis of occluded inf division of LT MCA using a microguidewire - Dr Corliss Skains 09/26/2013 1:11 AM  SUBJECTIVE Patient remains neurologically stable. She remains globally aphasic with right hemiplegia. Placed today as patient received tube feeding and planned for tomorrow. Blood pressure has remained high requiring intermittent Cardene and medications. Patient not transferred to the floor due to blood pressure management issues.  OBJECTIVE Most recent Vital Signs: Filed Vitals:   10/05/13 0645 10/05/13 0700 10/05/13 0730 10/05/13 0800  BP: 186/67 165/56 171/57   Pulse: 72 51  57  Temp:      TempSrc:      Resp: Height:      Weight:      SpO2: 100% 99%  100%   CBG (last 3)   Recent Labs  10/04/13 2318 10/05/13 0319 10/05/13 0752  GLUCAP 189* 188* 170*    IV Fluid Intake:   . sodium chloride 10 mL/hr at 10/05/13 0800  . niCARDipine Stopped (10/04/13 2334)    MEDICATIONS  . amLODipine  10 mg Per Tube Daily  . antiseptic oral rinse  7 mL Mouth Rinse QID  . bacitracin   Topical Daily  . carvedilol  6.25 mg Per Tube BID WC  . chlorhexidine  15 mL Mouth Rinse BID  . feeding supplement (VITAL HIGH PROTEIN)  1,000 mL Per Tube Q24H  . free water  100 mL Per Tube 3 times per day   . hydrALAZINE  100 mg Oral 3 times per day  . insulin aspart  0-15 Units Subcutaneous 6 times per day  . lisinopril  20 mg Per Tube BID  . pantoprazole  40 mg Oral Daily   PRN:  acetaminophen (TYLENOL) oral liquid 160 mg/5 mL, albuterol, hydrALAZINE, metoprolol, ondansetron (ZOFRAN) IV  Diet:    tube feedings Activity:  Bedrest DVT Prophylaxis:  Heparin subq.   CLINICALLY SIGNIFICANT STUDIES Basic Metabolic Panel:   Recent Labs Lab 10/04/13 0445 10/05/13 0457  NA 144 143  K 4.4 4.0  CL 110 108  CO2 24 24  GLUCOSE 153* 202*  BUN 37* 39*  CREATININE 0.71 0.65  CALCIUM 8.6 8.5   Liver Function Tests:  No results found for this basename: AST, ALT, ALKPHOS, BILITOT, PROT, ALBUMIN,  in the last 168 hours CBC:   Recent Labs Lab 10/04/13 0445 10/05/13 0457  WBC 9.1 9.3  HGB 10.4* 11.0*  HCT 31.5* 32.3*  MCV 91.0 91.2  PLT 292 324   Coagulation:  No results found for this basename: LABPROT, INR,  in the last 168 hours Cardiac Enzymes:  No results found for this basename: CKTOTAL, CKMB, CKMBINDEX, TROPONINI,  in the last 168 hours Urinalysis: No results found for this basename: COLORURINE, APPERANCEUR, LABSPEC, PHURINE, GLUCOSEU, HGBUR, BILIRUBINUR, KETONESUR, PROTEINUR,  UROBILINOGEN, NITRITE, LEUKOCYTESUR,  in the last 168 hours Lipid Panel    Component Value Date/Time   CHOL 136 09/26/2013 0330   TRIG 102 09/26/2013 0330   HDL 26* 09/26/2013 0330   CHOLHDL 5.2 09/26/2013 0330   VLDL 20 09/26/2013 0330   LDLCALC 90 09/26/2013 0330   HgbA1C  Lab Results  Component Value Date   HGBA1C 6.5* 09/26/2013    Urine Drug Screen:   No results found for this basename: labopia,  cocainscrnur,  labbenz,  amphetmu,  thcu,  labbarb    Alcohol Level: No results found for this basename: ETH,  in the last 168 hours  Occult blood in stool - negative.  Ct Head Wo Contrast 10/02/13  Typical evolution of left posterior MCA infarct without hemorrhage. No new findings.  09/26/2013    No  acute intracranial abnormality. Atrophy, chronic small vessel disease.    09/25/2013     1. No acute intracranial pathology seen on CT.  2. Mild to moderate cortical volume loss and diffuse small vessel ischemic microangiopathy.    Dg Chest Port 1 View 09/26/2013    1. Endotracheal tube seen ending 3-4 cm above the carina.  2. Left subclavian line seen ending about the distal SVC.  3. Enteric tube noted extending below the diaphragm, though the side port is seen ending about the distal esophagus.  4. No acute cardiopulmonary process seen; borderline cardiomegaly noted.    Ct Head Wo Contrast   09/27/2013   IMPRESSION: Tube and catheter positions as described without pneumothorax. Left base atelectasis. Elsewhere lungs clear.     09/29/2013 IMPRESSION: Increasing opacity at the right lung base consistent with atelectasis versus developing pneumonia.   MRI of the brain  IMPRESSION:  Acute infarct in the posterior portion of the left MCA territory.  Small area of acute infarct in the right medial occipital lobe.  Severe stenosis distal left M1 segment with occlusion of the  posterior segment of the left middle cerebral artery corresponding  to acute infarct. Decreased flow in the anterior division of the  left middle cerebral artery.  Carotid Doppler  Bilateral: 1-39% ICA stenosis. Vertebral artery flow is antegrade.  2D Echocardiogram  - LVEF 70-75%, severe concentric LVH, trileaflet calcified aortic valve with calcified mass on non-coronary cusp and calcification of the tip of the left coronary cusp, no significant stenosis, trivial AI, moderate TR, RVSP 53 (moderate pulmonary hypertension), diastolic dysfunction with indeterminate, but likely elevated LV Filling pressure.  EKG  Sinus or ectopic atrial rhythm rate 55 beats per minute.  For complete results please see formal report.   [place] left LE - Large subcutaneous collection compatible with hematoma, tracking from the left groin up the  left abdominal wall. No active hemorrhage identified on Doppler interrogation  CT abdomen - Mild bilateral pleural effusions are noted with adjacent subsegmental atelectasis. Mild to moderate anasarca is noted in the subcutaneous tissues of the abdomen and pelvis. Large hematoma seen in the left anterior abdominal wall. Also noted is 4.3 cm hyperdensity in the pelvis anterior to the uterus concerning for blood clot. Alternatively, it potentially may represent mass. Further evaluation with ultrasound or MRI is recommended.   Therapy Recommendations  SNF    Physical exam  General - thin, well developed, african [place] lady not in distress.   Ophthalmologic - not cooperative on exam.  Cardiovascular - Regular rate and rhythm with no murmur.  Respiratory - bilateral coarse rhonchi and decreased breath sounds.  Extremities -  right LE BKA, Left LE wound at left shin and left groin hematoma much improved with left flank ecchymosis.   NEUROLOGIC:  lethargic, open eyes on voice, globally aphasic and mumbles words but unintelligible, did not follow any commands except for sticking out her tongue. PERRL, sluggish to light reaction, left gaze and right side neglect, positive corneal and gag, left UE spontaneously movement, but right UE 1-2/5 on pain stimulation, b/l LEs withdraw to pain and against gravity. Left babinski negative, right BKA. Reflex 1+ throughout.  ASSESSMENT Victoria West is a 80 y.o. female presenting with aphasia and right hemiparesis . TPA was not administered secondary to late presentation. Status post left common carotid arteriogram, followed by mechanical thrombolysis of occluded inf division of LT MCA using a microguidewire - Dr Corliss Skains 09/26/2013 1:11 AM. Head CT revealed no acute findings. MRI confirmed left MCA stroke and M2 cut off.  On aspirin 81 mg orally every day prior to admission. Now on no antithrombotics or anticoagulation for secondary stroke prevention secondary  to left groin hematoma and subsequent anemia. Patient with resultant right hemiplegia.    Cardiac arrest, resolved  Hyperlipidemia  - Cholesterol 136; LDL 90 - Pravachol prior to admission, resume after po assess  Hypertension history  Dementia  Intubated on ventilator - extubated  Diabetes mellitus - hemoglobin A1c 6.5  Renal insufficiency  Low calcium level, resolved  Right BKA  Anemia - due to groin hematoma and later GIB, requiring blood transfusion.  PLAN Stroke - MRI confirmed left MCA stroke with M2 cut off.  - repeat CT  showed evolving left MCA stroke, no midline shift. - discontinued ASA due to low Hb requiring transfusion. May consider to resume once Hb stable. - discontinued heparin subq for DVT prophylaxis dropping Hb requiring transfusion. May consider to resume once hb stable. - SCDs not ordered secondary to right BKA and no palpable pulse left lower extremity. - Anemia - Hb stable at 11 after 2 PRBC transfusion. Continue to monitor  - Hypokalemia - resolved, this am 4.4. Will give oral supplement - palliative care on board - had discussion with son today informed him about diagnosis, treatment options, and possible prognosis. Family agree to DO NOT RESUSCITATE status  but  wish for G tube and NH placement.  Respiratory failure - remains extubated - ABG largely WNL 09/29/13 - CXR unchanged stable, will repeat today - CCM on board  Black stool - black stool this am again - Hb stable at 11.2 - stool guaiac neg x 2 - d/c protonix drip, put on protonix  bid   HTN - initially hypotension on dopamine, later cardene drip for HTN - was off cardene drip, restarted last night for BP > 170, only on for a couple of hours prior to being turned off - on lisinopril, amlodipine, coreg, hydralazine - will increase SBP goal to < 200  Left Groin hematoma - Hematoma and left femoral artery puncture site, requiring transfusion - hematoma resided  - no active  bleeding - [place] and CT showed large hematoma tracking to abdominal wall - close monitor H&H  Anemia - 2 units PRBC transfusion yesterday - repeat CBC stable and Hb up to 11.2 today - close monitor  Pressure Wounds L tibial, L heel, sacral (stage 2) D/w son and answered questions D/w Dr Lindie Spruce  Delia Heady, MD   To contact Stroke Continuity provider, please refer to [email or web address]. After hours, contact General Neurology

## 2013-10-05 NOTE — Progress Notes (Signed)
UR completed. Planned for return to her SNF when medically stable. PEG placement scheduled for tomorrow.   Carlyle Lipa, RN BSN MHA CCM Trauma/Neuro ICU Case Manager [phone]

## 2013-10-05 NOTE — Progress Notes (Signed)
Patient Victoria West      DOB: 11-May-1924      AVW:[id number]  Patient remains as previously described.  Spoke with her son French Ana by phone.  Goals remain to place PEG and return [facility].  He will consider palliative vs hospice with [facility] on return.  Will shadow with you.  Merryn Thaker L. Ladona Ridgel, MD MBA The Palliative Medicine Team at [facility] Phone: [phone] Pager: [phone] ( Use team phone after hours)

## 2013-10-06 ENCOUNTER — Inpatient Hospital Stay (HOSPITAL_COMMUNITY): Payer: Medicare Other | Admitting: Anesthesiology

## 2013-10-06 ENCOUNTER — Encounter (HOSPITAL_COMMUNITY): Payer: Medicare Other | Admitting: Anesthesiology

## 2013-10-06 ENCOUNTER — Encounter (HOSPITAL_COMMUNITY): Payer: Self-pay

## 2013-10-06 ENCOUNTER — Encounter (HOSPITAL_COMMUNITY)
Admission: EM | Disposition: A | Discharge status: Skilled Nursing Facility | Payer: Self-pay | Source: Home / Self Care | Attending: Neurology

## 2013-10-06 HISTORY — PX: ESOPHAGOGASTRODUODENOSCOPY: SHX5428

## 2013-10-06 HISTORY — PX: PEG PLACEMENT: SHX5437

## 2013-10-06 LAB — BASIC METABOLIC PANEL
Anion gap: 10 (ref 5–15)
BUN: 39 mg/dL — ABNORMAL HIGH (ref 6–23)
CO2: 23 meq/L (ref 19–32)
Calcium: 8.4 mg/dL (ref 8.4–10.5)
Chloride: 112 mEq/L (ref 96–112)
Creatinine, Ser: 0.61 mg/dL (ref 0.50–1.10)
GFR calc Af Amer: >90 mL/min (ref >90)
GFR calc non Af Amer: 79 mL/min — ABNORMAL LOW (ref >90)
Glucose, Bld: 169 mg/dL — ABNORMAL HIGH (ref 70–99)
POTASSIUM: 3.9 meq/L (ref 3.7–5.3)
SODIUM: 145 meq/L (ref 137–147)

## 2013-10-06 LAB — GLUCOSE, CAPILLARY
GLUCOSE-CAPILLARY: 164 mg/dL — AB (ref 70–99)
GLUCOSE-CAPILLARY: 151 mg/dL — AB (ref 70–99)
GLUCOSE-CAPILLARY: 66 mg/dL — AB (ref 70–99)
Glucose-Capillary: 158 mg/dL — ABNORMAL HIGH (ref 70–99)
Glucose-Capillary: 165 mg/dL — ABNORMAL HIGH (ref 70–99)
Glucose-Capillary: 61 mg/dL — ABNORMAL LOW (ref 70–99)
Glucose-Capillary: 84 mg/dL (ref 70–99)

## 2013-10-06 LAB — CBC
HCT: 31.5 % — ABNORMAL LOW (ref 36.0–46.0)
Hemoglobin: 10.4 g/dL — ABNORMAL LOW (ref 12.0–15.0)
MCH: 30.2 pg (ref 26.0–34.0)
MCHC: 33 g/dL (ref 30.0–36.0)
MCV: 91.6 fL (ref 78.0–100.0)
PLATELETS: 323 10*3/uL (ref 150–400)
RBC: 3.44 MIL/uL — AB (ref 3.87–5.11)
RDW: 17 % — ABNORMAL HIGH (ref 11.5–15.5)
WBC: 10.4 10*3/uL (ref 4.0–10.5)

## 2013-10-06 SURGERY — EGD (ESOPHAGOGASTRODUODENOSCOPY)
Anesthesia: Monitor Anesthesia Care

## 2013-10-06 MED ORDER — DEXTROSE 50 % IV SOLN
25.0000 mL | Freq: Once | INTRAVENOUS | Status: AC | PRN
  Administered 2013-10-06: 25 mL via INTRAVENOUS
  Filled 2013-10-06: qty 50

## 2013-10-06 MED ORDER — DEXTROSE 50 % IV SOLN
25.0000 mL | Freq: Once | INTRAVENOUS | Status: AC | PRN
  Administered 2013-10-06: 25 mL via INTRAVENOUS

## 2013-10-06 MED ORDER — FENTANYL CITRATE 0.05 MG/ML IJ SOLN
INTRAMUSCULAR | Status: DC | PRN
  Administered 2013-10-06 (×2): 25 ug via INTRAVENOUS

## 2013-10-06 MED ORDER — SODIUM CHLORIDE 0.9 % IV SOLN
INTRAVENOUS | Status: DC | PRN
  Administered 2013-10-06: 14:00:00 via INTRAVENOUS

## 2013-10-06 MED ORDER — PROPOFOL INFUSION 10 MG/ML OPTIME
INTRAVENOUS | Status: DC | PRN
  Administered 2013-10-06: 100 ug/kg/min via INTRAVENOUS

## 2013-10-06 MED ORDER — CEFAZOLIN SODIUM 1-5 GM-% IV SOLN
1.0000 g | INTRAVENOUS | Status: AC
  Administered 2013-10-06: 2 g via INTRAVENOUS
  Filled 2013-10-06: qty 50

## 2013-10-06 NOTE — Progress Notes (Signed)
Pt arrived to 4N07 via bed, transported by RN and nurse tech. Pt max assist. Transferred to bed. Vitals obtained and stable. Call bed within reach.

## 2013-10-06 NOTE — Progress Notes (Signed)
Patient not made NPO until 800 AM.  Tube feednings stopped then.  Will go for PEG today at 2:00PM.  Marta Lamas. Gae Bon, MD, FACS [phone] Trauma Surgeon

## 2013-10-06 NOTE — Progress Notes (Signed)
Hypoglycemic Event  CBG: 66  Treatment: D50 IV 25 mL  Symptoms: None  Follow-up CBG: Time: 0011 CBG Result: 110  Possible Reasons for Event: Inadequate meal intake  Comments/MD notified:    Alvira Philips  Remember to initiate Hypoglycemia Order Set & complete

## 2013-10-06 NOTE — Progress Notes (Signed)
Stroke Team Progress Note  HISTORY Victoria West is an 80 y.o. female with a history of hypertension, hypothyroidism, glaucoma and dementia who was brought to the emergency room 09/25/2013 after being found unable to speak as well as unable to move her right side and gaze to the left side. There is no previous history of stroke. CT scan of her head showed no acute intracranial abnormality. She was last seen well at 1700 today. She was beyond time window for intervention with TPA when she arrived in the emergency room. NIH stroke score was 24. S/P Left common carotid arteriogram,followed by mechanical thrombolysis of occluded inf division of LT MCA using a microguidewire - Dr Corliss Skains 09/26/2013 1:11 AM  SUBJECTIVE No family at the bedside. Tube feedings not turned off until 8a this am. Surgery plans to place PEG later this afternoon.   OBJECTIVE Most recent Vital Signs: Filed Vitals:   10/05/13 2111 10/06/13 0157 10/06/13 0500 10/06/13 1000  BP: 197/74 182/67 178/59 196/64  Pulse: 62 59 61 73  Temp: 97.7 F (36.5 C) 98.2 F (36.8 C) 98 F (36.7 C) 98.6 F (37 C)  TempSrc: Oral Oral Oral Oral  Resp: Height:      Weight: 53.434 kg (117 lb 12.8 oz)     SpO2: 100% 100% 100% 96%   CBG (last 3)   Recent Labs  10/05/13 2350 10/06/13 0350 10/06/13 0800  GLUCAP 134* 158* 164*    IV Fluid Intake:   . sodium chloride 75 mL/hr at 10/06/13 0002    MEDICATIONS  . amLODipine  10 mg Per Tube Daily  . antiseptic oral rinse  7 mL Mouth Rinse QID  . bacitracin   Topical Daily  . carvedilol  6.25 mg Per Tube BID WC  . chlorhexidine  15 mL Mouth Rinse BID  . feeding supplement (VITAL HIGH PROTEIN)  1,000 mL Per Tube Q24H  . free water  100 mL Per Tube 3 times per day  . hydrALAZINE  100 mg Oral 3 times per day  . insulin aspart  0-15 Units Subcutaneous 6 times per day  . lisinopril  20 mg Per Tube BID  . pantoprazole  40 mg Oral Daily   PRN:  acetaminophen (TYLENOL) oral liquid  160 mg/5 mL, albuterol, hydrALAZINE, metoprolol, ondansetron (ZOFRAN) IV  Diet:  NPO tube feedings Activity:  Bedrest DVT Prophylaxis:  Pharmacological VTE prophy on hold for PEG; unable to place SCDs due to leg wounds  CLINICALLY SIGNIFICANT STUDIES Basic Metabolic Panel:   Recent Labs Lab 10/05/13 0457 10/06/13 0437  NA 143 145  K 4.0 3.9  CL 108 112  CO2 24 23  GLUCOSE 202* 169*  BUN 39* 39*  CREATININE 0.65 0.61  CALCIUM 8.5 8.4   Liver Function Tests:  No results found for this basename: AST, ALT, ALKPHOS, BILITOT, PROT, ALBUMIN,  in the last 168 hours CBC:   Recent Labs Lab 10/05/13 0457 10/06/13 0437  WBC 9.3 10.4  HGB 11.0* 10.4*  HCT 32.3* 31.5*  MCV 91.2 91.6  PLT 324 323   Coagulation:  No results found for this basename: LABPROT, INR,  in the last 168 hours Cardiac Enzymes:  No results found for this basename: CKTOTAL, CKMB, CKMBINDEX, TROPONINI,  in the last 168 hours Urinalysis: No results found for this basename: COLORURINE, APPERANCEUR, LABSPEC, PHURINE, GLUCOSEU, HGBUR, BILIRUBINUR, KETONESUR, PROTEINUR, UROBILINOGEN, NITRITE, LEUKOCYTESUR,  in the last 168 hours Lipid Panel    Component Value Date/Time  CHOL 136 09/26/2013 0330   TRIG 102 09/26/2013 0330   HDL 26* 09/26/2013 0330   CHOLHDL 5.2 09/26/2013 0330   VLDL 20 09/26/2013 0330   LDLCALC 90 09/26/2013 0330   HgbA1C  Lab Results  Component Value Date   HGBA1C 6.5* 09/26/2013    Urine Drug Screen:   No results found for this basename: labopia,  cocainscrnur,  labbenz,  amphetmu,  thcu,  labbarb    Alcohol Level: No results found for this basename: ETH,  in the last 168 hours  Occult blood in stool - negative.  Ct Head Wo Contrast 10/02/13  Typical evolution of left posterior MCA infarct without hemorrhage. No new findings.  09/26/2013    No acute intracranial abnormality. Atrophy, chronic small vessel disease.    09/25/2013    1. No acute intracranial pathology seen on CT. 2. Mild to  moderate cortical volume loss and diffuse small vessel ischemic microangiopathy.    MRI/MRA of the brain  09/27/2013 Acute infarct in the posterior portion of the left MCA territory. Small area of acute infarct in the right medial occipital lobe. Severe stenosis distal left M1 segment with occlusion of the posterior segment of the left middle cerebral artery corresponding to acute infarct. Decreased flow in the anterior division of the left middle cerebral artery.  Carotid Doppler  Bilateral: 1-39% ICA stenosis. Vertebral artery flow is antegrade.  [place] left LE 10/01/2013 Large subcutaneous collection compatible with hematoma, tracking from the left groin up the left abdominal wall. No active hemorrhage identified on Doppler interrogation  2D Echocardiogram  - LVEF 70-75%, severe concentric LVH, trileaflet calcified aortic valve with calcified mass on non-coronary cusp and calcification of the tip of the left coronary cusp, no significant stenosis, trivial AI, moderate TR, RVSP 53 (moderate pulmonary hypertension), diastolic dysfunction with indeterminate, but likely elevated LV Filling pressure.  EKG  Sinus or ectopic atrial rhythm rate 55 beats per minute.  For complete results please see formal report.   Dg Chest Port 1 View 10/03/2013 Left lower lobe consolidation. Stable cardiomegaly. No pneumothorax. 09/29/2013   Increasing opacity at the right lung base consistent with atelectasis versus developing pneumonia.  09/27/2013   Tube and catheter positions as described without pneumothorax. Left base atelectasis. Elsewhere lungs clear.    09/26/2013   1. Endotracheal tube seen ending 3-4 cm above the carina. 2. Left subclavian line seen ending about the distal SVC. 3. Enteric tube noted extending below the diaphragm, though the side port is seen ending about the distal esophagus. 4. No acute cardiopulmonary process seen; borderline cardiomegaly noted.     ABD portable 10/02/2013 Feeding tube tip in  stomach. Suspect a degree of colonic ileus. The soft tissue mass in the left pelvic region noted on CT is also apparent on radiographic examination.  09/30/2013 Feeding tube is in place with the tip in the stomach. 09/30/2013 1. Feeding tube looped in the distal esophagus.  CT abdomen & pelvix 10/02/2013  Mild bilateral pleural effusions are noted with adjacent subsegmental atelectasis. Mild to moderate anasarca is noted in the subcutaneous tissues of the abdomen and pelvis. Large hematoma seen in the left anterior abdominal wall. Also noted is 4.3 cm hyperdensity in the pelvis anterior to the uterus concerning for blood clot. Alternatively, it potentially may represent mass. Further evaluation with ultrasound or MRI is recommended.   Therapy Recommendations  SNF     Physical exam General - thin, well developed, african [place] lady not in distress.  Ophthalmologic - not cooperative on exam.  Cardiovascular - Regular rate and rhythm with no murmur.  Respiratory - bilateral coarse rhonchi and decreased breath sounds.  Extremities - right LE BKA, Left LE wound at left shin and left groin hematoma much improved with left flank ecchymosis.   NEUROLOGIC:  lethargic, open eyes on voice, globally aphasic and mumbles words but unintelligible, did not follow any commands except for sticking out her tongue. PERRL, sluggish to light reaction, left gaze and right side neglect, positive corneal and gag, left UE spontaneously movement, but right UE 1-2/5 on pain stimulation, b/l LEs withdraw to pain and against gravity. Left babinski negative, right BKA. Reflex 1+ throughout.  ASSESSMENT Ms. Victoria West is a 80 y.o. female presenting with aphasia and right hemiparesis . TPA was not administered secondary to late presentation. Status post left common carotid arteriogram, followed by mechanical thrombolysis of occluded inf division of LT MCA using a microguidewire - Dr Corliss Skains 09/26/2013 1:11 AM. Head CT  revealed no acute findings. MRI confirmed left MCA stroke and M2 cut off.  On aspirin 81 mg orally every day prior to admission. Now on no antithrombotics or anticoagulation for secondary stroke prevention secondary to left groin hematoma and subsequent anemia. Patient with resultant right hemiplegia, dysphagia.   Cardiac arrest, resolved  Hyperlipidemia  - Cholesterol 136; LDL 90 - Pravachol prior to admission, resume after po assess  Hypertension history  Dementia  Intubated on ventilator - extubated  Diabetes mellitus - hemoglobin A1c 6.5  Renal insufficiency  Low calcium level, resolved  Right BKA  Anemia - due to groin hematoma and later GIB, requiring blood transfusion.  PLAN Stroke - MRI confirmed left MCA stroke with M2 cut off.  - repeat CT  showed evolving left MCA stroke, no midline shift. - discontinued ASA due to low Hb requiring transfusion. May consider to resume once Hb stable. - discontinued heparin subq for DVT prophylaxis dropping Hb requiring transfusion. May consider to resume once hb stable. - SCDs not ordered secondary to right BKA and no palpable pulse left lower extremity. - Anemia - Hb stable at 11 after 2 PRBC transfusion. Continue to monitor  - Hypokalemia - resolved, this am 4.4. Will give oral supplement - palliative care on board - DO NOT RESUSCITATE -plan G tube placement by Trauma today, then NH placement.  Respiratory failure - resolved  Anemia/black stook - Hb stable at 10.4 - transfused - stool guaiac neg x 2 - d/c protonix drip, put on protonix  bid   HTN - initially hypotension on dopamine, later cardene drip for HTN - was off cardene drip, restarted for BP > 170, only on for a couple of hours prior to being turned off - on lisinopril, amlodipine, coreg, hydralazine - BP 144-205/64-69 past 24h   - received hydralazine x 1 past 24h - SBP goal to < 200  Left Groin hematoma - Hematoma and left femoral artery puncture site,  requiring transfusion - hematoma resided  - no active bleeding - [place] and CT showed large hematoma tracking to abdominal wall - close monitor H&H  Pressure Wounds L tibial, L heel, sacrum (stage 2) Wound RN recommendations    Annie Main, MSN, RN, ANVP-BC, ANP-BC, GNP-BC [facility] Pager: [phone] 10/06/2013 10:53 AM  I have personally examined this patient, reviewed notes, independently viewed imaging studies, participated in medical decision making and plan of care. I have made any additions or clarifications directly to the above note. Agree with  note above.   Delia Heady, MD Medical Director [facility] Pager: [phone] 10/06/2013 3:51 PM  To contact Stroke Continuity provider, please refer to [email or web address]. After hours, contact General Neurology

## 2013-10-06 NOTE — Transfer of Care (Signed)
Immediate Anesthesia Transfer of Care Note  Patient: Victoria West  Procedure(s) Performed: Procedure(s): ESOPHAGOGASTRODUODENOSCOPY (EGD) (N/A) PERCUTANEOUS ENDOSCOPIC GASTROSTOMY (PEG) PLACEMENT (N/A)  Patient Location: PACU  Anesthesia Type:MAC  Level of Consciousness: awake and alert   Airway & Oxygen Therapy: Patient Spontanous Breathing and Patient connected to nasal cannula oxygen  Post-op Assessment: Report given to PACU RN  Post vital signs: Reviewed and stable  Complications: No apparent anesthesia complications

## 2013-10-06 NOTE — Care Management Note (Addendum)
  Page 1 of 1   10/11/2013     3:14:32 PM CARE MANAGEMENT NOTE 10/11/2013  Patient:  [facility]   Account Number:  [id number]  Date Initiated:  09/30/2013  Documentation initiated by:  Carlyle Lipa  Subjective/Objective Assessment:   CVA; too late for tPA; had IR procedure. Remains obtunded. Cardene gtt.     Action/Plan:   await arrival of son from [place] to make decisions   Anticipated DC Date:  10/01/2013   Anticipated DC Plan:  SKILLED NURSING FACILITY         Choice offered to / List presented to:             Status of service:  In process, will continue to follow Medicare Important Message given?  YES (If response is "NO", the following Medicare IM given date fields will be blank) Date Medicare IM given:  10/06/2013 Medicare IM given by:  Elmer Bales Date Additional Medicare IM given:  10/11/2013 Additional Medicare IM given by:  Elmer Bales  Discharge Disposition:    Per UR Regulation:  Reviewed for med. necessity/level of care/duration of stay  If discussed at Long Length of Stay Meetings, dates discussed:   09/30/2013  10/05/2013    Comments:  10/11/13 1025 Elmer Bales RN, MSN, CM- Additional Medicare IM letter provided.    10/08/13 1130 Elmer Bales RN, MSN, CM- Additional Medicare IM letter provided.   10/06/13 1500 Elmer Bales RN, MSN, CM- Medicare IM letter provided.   10/05/2013 Carlyle Lipa, RN BSN [facility] CCM [id number]--Pt planned for PEG tomorrow and then will need SNF placement. Family has made her a DNR and she was extubated on 8/18.

## 2013-10-06 NOTE — Anesthesia Preprocedure Evaluation (Signed)
Anesthesia Evaluation  Patient identified by MRN, date of birth, ID band Patient awake    Reviewed: Allergy & Precautions, H&P , NPO status , Patient's Chart, lab work & pertinent test results  Airway Mallampati: II TM Distance: >3 FB Neck ROM: Full    Dental no notable dental hx.    Pulmonary neg pulmonary ROS,  breath sounds clear to auscultation  Pulmonary exam normal       Cardiovascular hypertension, Pt. on medications and Pt. on home beta blockers Rhythm:Regular Rate:Normal     Neuro/Psych Recent left MCA territory large ischemic infarction. Right sided facial droop, slurred speech. Left sided gaze. CVA, Residual Symptoms negative psych ROS   GI/Hepatic negative GI ROS, Neg liver ROS,   Endo/Other  neg diabetes  Renal/GU negative Renal ROS  negative genitourinary   Musculoskeletal negative musculoskeletal ROS (+)   Abdominal   Peds negative pediatric ROS (+)  Hematology  (+) anemia ,   Anesthesia Other Findings   Reproductive/Obstetrics negative OB ROS                           Anesthesia Physical Anesthesia Plan  ASA: III  Anesthesia Plan: MAC   Post-op Pain Management:    Induction: Intravenous  Airway Management Planned:   Additional Equipment:   Intra-op Plan:   Post-operative Plan:   Informed Consent: I have reviewed the patients History and Physical, chart, labs and discussed the procedure including the risks, benefits and alternatives for the proposed anesthesia with the patient or authorized representative who has indicated his/her understanding and acceptance.   Dental advisory given  Plan Discussed with: CRNA  Anesthesia Plan Comments:         Anesthesia Quick Evaluation

## 2013-10-06 NOTE — Progress Notes (Signed)
Patient returned from peg tube placement. Alert and in stable condition. Peg tube patent and medication given via tube as ordered. Abdominal binder in place.

## 2013-10-06 NOTE — Op Note (Signed)
OPERATIVE REPORT  DATE OF OPERATION: 09/25/2013 - 10/06/2013  PATIENT:  Victoria West  80 y.o. female  PRE-OPERATIVE DIAGNOSIS:  difficulty swallowing  POST-OPERATIVE DIAGNOSIS:  PEG placement  PROCEDURE:  Procedure(s): ESOPHAGOGASTRODUODENOSCOPY (EGD) PERCUTANEOUS ENDOSCOPIC GASTROSTOMY (PEG) PLACEMENT  SURGEON:  Surgeon(s): Cherylynn Ridges, MD  ASSISTANT: Leotis Shames, PA-C  ANESTHESIA:   MAC  EBL: <10 ml  BLOOD ADMINISTERED: none  DRAINS: Gastrostomy Tube   SPECIMEN:  No Specimen  COUNTS CORRECT:  YES  PROCEDURE DETAILS: The procedure was performed and the endoscopy suite with anesthesia providing sedationAnd monitoring her vital signs and airway.  A Pentax 2990 upper endoscope was used to cannulate the oropharynx and subsequently the proximal esophagus with the soft feeding tube in place. Once the endoscope and passed into the stomach the feeding tube was removed.  The past endoscope through the body of the stomach down to the pylorus where bowel can be seen emptying into the distal stomach. There is no evidence of pathology at this point.  A small polyp was seen on the anterior gastric wall which was not biopsied and some pigmented stippling was noted on the posterior wall which possibly represented staining from some type of GI bleeding and hemosiderin.  The endoscope was brought back into the proximal part of the body of the stomach where we could see the impression of the assistant's finger on the intra-abdominal wall. It was at that site under anesthesia where an Angiocath was passed into the stomach through an incision and this subsequently used to pass a loop blue wire which was brought out through the patient's mouth.  The loop the blue wire was secured to the pull-through gastrostomy tube which subsequently passed back through the patient's mouth taking the bolster to the anterior abdominal wall. The gastrostomy tube was secured in place and a picture of the 2 was taken in  position after passing the endoscope into the stomach again.  All counts were correct including needles, sponges, and instruments.    G-tube   Esophagus w/FT Pigmented staining  Pylorus   PATIENT DISPOSITION:  PACU - hemodynamically stable.   Cherylynn Ridges 8/26/20153:16 PM

## 2013-10-06 NOTE — Progress Notes (Addendum)
Hypoglycemic Event  CBG: 61  Treatment: D50 IV 25 mL  Symptoms: None  Follow-up CBG: Time:2110 CBG Result:84  Possible Reasons for Event: Inadequate meal intake  Comments/MD notified:    Victoria West  Remember to initiate Hypoglycemia Order Set & complete

## 2013-10-07 ENCOUNTER — Encounter (HOSPITAL_COMMUNITY): Payer: Self-pay | Admitting: General Surgery

## 2013-10-07 ENCOUNTER — Encounter (HOSPITAL_COMMUNITY)
Admission: EM | Disposition: A | Discharge status: Skilled Nursing Facility | Payer: Self-pay | Source: Home / Self Care | Attending: Neurology

## 2013-10-07 HISTORY — PX: PEG PLACEMENT: SHX5437

## 2013-10-07 HISTORY — PX: ESOPHAGOGASTRODUODENOSCOPY: SHX5428

## 2013-10-07 LAB — GLUCOSE, CAPILLARY
GLUCOSE-CAPILLARY: 114 mg/dL — AB (ref 70–99)
GLUCOSE-CAPILLARY: 109 mg/dL — AB (ref 70–99)
GLUCOSE-CAPILLARY: 118 mg/dL — AB (ref 70–99)
Glucose-Capillary: 110 mg/dL — ABNORMAL HIGH (ref 70–99)
Glucose-Capillary: 119 mg/dL — ABNORMAL HIGH (ref 70–99)
Glucose-Capillary: 96 mg/dL (ref 70–99)

## 2013-10-07 LAB — BASIC METABOLIC PANEL
ANION GAP: 13 (ref 5–15)
BUN: 29 mg/dL — ABNORMAL HIGH (ref 6–23)
CALCIUM: 8.3 mg/dL — AB (ref 8.4–10.5)
CO2: 21 mEq/L (ref 19–32)
Chloride: 110 mEq/L (ref 96–112)
Creatinine, Ser: 0.6 mg/dL (ref 0.50–1.10)
GFR, EST NON AFRICAN AMERICAN: 80 mL/min — AB (ref >90)
Glucose, Bld: 118 mg/dL — ABNORMAL HIGH (ref 70–99)
Potassium: 3.6 mEq/L — ABNORMAL LOW (ref 3.7–5.3)
Sodium: 144 mEq/L (ref 137–147)

## 2013-10-07 LAB — CBC
HEMATOCRIT: 34.9 % — AB (ref 36.0–46.0)
Hemoglobin: 11.2 g/dL — ABNORMAL LOW (ref 12.0–15.0)
MCH: 30.6 pg (ref 26.0–34.0)
MCHC: 32.1 g/dL (ref 30.0–36.0)
MCV: 95.4 fL (ref 78.0–100.0)
PLATELETS: 363 10*3/uL (ref 150–400)
RBC: 3.66 MIL/uL — ABNORMAL LOW (ref 3.87–5.11)
RDW: 17.2 % — ABNORMAL HIGH (ref 11.5–15.5)
WBC: 11 10*3/uL — AB (ref 4.0–10.5)

## 2013-10-07 SURGERY — EGD (ESOPHAGOGASTRODUODENOSCOPY)
Anesthesia: Moderate Sedation

## 2013-10-07 MED ORDER — MIDAZOLAM HCL 5 MG/ML IJ SOLN
INTRAMUSCULAR | Status: AC
  Filled 2013-10-07: qty 1

## 2013-10-07 MED ORDER — MIDAZOLAM HCL 10 MG/2ML IJ SOLN
INTRAMUSCULAR | Status: DC | PRN
  Administered 2013-10-07 (×2): 1 mg via INTRAVENOUS

## 2013-10-07 MED ORDER — FENTANYL CITRATE 0.05 MG/ML IJ SOLN
INTRAMUSCULAR | Status: AC
  Filled 2013-10-07: qty 2

## 2013-10-07 MED ORDER — SODIUM CHLORIDE 0.9 % IV SOLN: Freq: Once | INTRAVENOUS | Status: DC

## 2013-10-07 MED ORDER — FENTANYL CITRATE 0.05 MG/ML IJ SOLN
INTRAMUSCULAR | Status: DC | PRN
  Administered 2013-10-07 (×3): 25 ug via INTRAVENOUS

## 2013-10-07 MED ORDER — CEFAZOLIN SODIUM 1-5 GM-% IV SOLN
1.0000 g | Freq: Once | INTRAVENOUS | Status: AC
  Administered 2013-10-07: 1 g via INTRAVENOUS
  Filled 2013-10-07: qty 50

## 2013-10-07 NOTE — Progress Notes (Signed)
NG tube placed in the right nostril. Pt's nose had some mild bleeding irration from tube placement. NG tube was placed on low wall suction per MD order. There was a scant amount of gastric content that came out when suction was placed. Will continue to monitor Pt. Rema Fendt, RN

## 2013-10-07 NOTE — Progress Notes (Signed)
Came by to see the patient today, she had pulled out her PEG.  Will attempt to replace at 1100 today in endo.  May need surgery.  Not septic and no peritonitis.  Marta Lamas. Gae Bon, MD, FACS [phone] Trauma Surgeon

## 2013-10-07 NOTE — Progress Notes (Signed)
Pt is NPO with recent PEG tube placement. Tube feed due to start back tomorrow. Pt had hypoglycemic event. CBG 61. Dextrose given and blood sugar now at 84.  Md notified of event and no new orders given. Will continue to monitor CBG q4 per Md.

## 2013-10-07 NOTE — Anesthesia Postprocedure Evaluation (Signed)
  Anesthesia Post-op Note  Patient: Victoria West  Procedure(s) Performed: Procedure(s) (LRB): ESOPHAGOGASTRODUODENOSCOPY (EGD) (N/A) PERCUTANEOUS ENDOSCOPIC GASTROSTOMY (PEG) PLACEMENT (N/A)  Patient Location: PACU  Anesthesia Type: MAC  Level of Consciousness: awake and alert   Airway and Oxygen Therapy: Patient Spontanous Breathing  Post-op Pain: mild  Post-op Assessment: Post-op Vital signs reviewed, Patient's Cardiovascular Status Stable, Respiratory Function Stable, Patent Airway and No signs of Nausea or vomiting  Last Vitals:  Filed Vitals:   10/07/13 0551  BP: 158/41  Pulse: 67  Temp: 36.7 C  Resp: 16    Post-op Vital Signs: stable   Complications: No apparent anesthesia complications

## 2013-10-07 NOTE — Progress Notes (Signed)
Stroke Team Progress Note  HISTORY Victoria West is an 80 y.o. female with a history of hypertension, hypothyroidism, glaucoma and dementia who was brought to the emergency room 09/25/2013 after being found unable to speak as well as unable to move her right side and gaze to the left side. There is no previous history of stroke. CT scan of her head showed no acute intracranial abnormality. She was last seen well at 1700 today. She was beyond time window for intervention with TPA when she arrived in the emergency room. NIH stroke score was 24. S/P Left common carotid arteriogram,followed by mechanical thrombolysis of occluded inf division of LT MCA using a microguidewire - Dr Corliss Skains 09/26/2013 1:11 AM  SUBJECTIVE PEG placed yesterday afternoon. Removed by patient during the night. Patient currently in endoscopy for replacement. May need OR.  OBJECTIVE Most recent Vital Signs: Filed Vitals:   10/07/13 0500 10/07/13 0551 10/07/13 1013 10/07/13 1033  BP:  158/41 173/85 230/73  Pulse:  67 75 98  Temp:  98 F (36.7 C) 97.8 F (36.6 C) 97.6 F (36.4 C)  TempSrc:  Oral Oral Oral  Resp:  Height:      Weight: 58.06 kg (128 lb)     SpO2:  100% 100% 98%   CBG (last 3)   Recent Labs  10/07/13 0004 10/07/13 0356 10/07/13 0808  GLUCAP 110* 96 114*    IV Fluid Intake:   . sodium chloride 75 mL/hr at 10/06/13 1949    MEDICATIONS  . [facility] HOLD] amLODipine  10 mg Per Tube Daily  . [facility] HOLD] antiseptic oral rinse  7 mL Mouth Rinse QID  . [MAR HOLD] bacitracin   Topical Daily  . [facility] HOLD] carvedilol  6.25 mg Per Tube BID WC  . [facility] HOLD] chlorhexidine  15 mL Mouth Rinse BID  . [MAR HOLD] feeding supplement (VITAL HIGH PROTEIN)  1,000 mL Per Tube Q24H  . [facility] HOLD] free water  100 mL Per Tube 3 times per day  . [facility] HOLD] hydrALAZINE  100 mg Oral 3 times per day  . [MAR HOLD] insulin aspart  0-15 Units Subcutaneous 6 times per day  . [facility] HOLD] lisinopril  20 mg Per Tube BID  .  [facility] HOLD] pantoprazole  40 mg Oral Daily   PRN:  [MAR HOLD] acetaminophen (TYLENOL) oral liquid 160 mg/5 mL, [MAR HOLD] albuterol, [MAR HOLD] hydrALAZINE, [MAR HOLD] metoprolol, [MAR HOLD] ondansetron (ZOFRAN) IV  Diet:  NPO  Activity:  Bedrest DVT Prophylaxis:  Pharmacological VTE prophy on hold for PEG; unable to place SCDs due to leg wounds  CLINICALLY SIGNIFICANT STUDIES Basic Metabolic Panel:   Recent Labs Lab 10/06/13 0437 10/07/13 0728  NA 145 144  K 3.9 3.6*  CL 112 110  CO2 23 21  GLUCOSE 169* 118*  BUN 39* 29*  CREATININE 0.61 0.60  CALCIUM 8.4 8.3*   Liver Function Tests:  No results found for this basename: AST, ALT, ALKPHOS, BILITOT, PROT, ALBUMIN,  in the last 168 hours CBC:   Recent Labs Lab 10/06/13 0437 10/07/13 0728  WBC 10.4 11.0*  HGB 10.4* 11.2*  HCT 31.5* 34.9*  MCV 91.6 95.4  PLT 323 363   Coagulation:  No results found for this basename: LABPROT, INR,  in the last 168 hours Cardiac Enzymes:  No results found for this basename: CKTOTAL, CKMB, CKMBINDEX, TROPONINI,  in the last 168 hours Urinalysis: No results found for this basename: COLORURINE, APPERANCEUR, LABSPEC, PHURINE, GLUCOSEU,  HGBUR, BILIRUBINUR, KETONESUR, PROTEINUR, UROBILINOGEN, NITRITE, LEUKOCYTESUR,  in the last 168 hours Lipid Panel    Component Value Date/Time   CHOL 136 09/26/2013 0330   TRIG 102 09/26/2013 0330   HDL 26* 09/26/2013 0330   CHOLHDL 5.2 09/26/2013 0330   VLDL 20 09/26/2013 0330   LDLCALC 90 09/26/2013 0330   HgbA1C  Lab Results  Component Value Date   HGBA1C 6.5* 09/26/2013    Urine Drug Screen:   No results found for this basename: labopia,  cocainscrnur,  labbenz,  amphetmu,  thcu,  labbarb    Alcohol Level: No results found for this basename: ETH,  in the last 168 hours  Occult blood in stool - negative.  Ct Head Wo Contrast 10/02/13  Typical evolution of left posterior MCA infarct without hemorrhage. No new findings.  09/26/2013    No acute  intracranial abnormality. Atrophy, chronic small vessel disease.    09/25/2013    1. No acute intracranial pathology seen on CT. 2. Mild to moderate cortical volume loss and diffuse small vessel ischemic microangiopathy.    MRI/MRA of the brain  09/27/2013 Acute infarct in the posterior portion of the left MCA territory. Small area of acute infarct in the right medial occipital lobe. Severe stenosis distal left M1 segment with occlusion of the posterior segment of the left middle cerebral artery corresponding to acute infarct. Decreased flow in the anterior division of the left middle cerebral artery.  Carotid Doppler  Bilateral: 1-39% ICA stenosis. Vertebral artery flow is antegrade.  [place] left LE 10/01/2013 Large subcutaneous collection compatible with hematoma, tracking from the left groin up the left abdominal wall. No active hemorrhage identified on Doppler interrogation  2D Echocardiogram  - LVEF 70-75%, severe concentric LVH, trileaflet calcified aortic valve with calcified mass on non-coronary cusp and calcification of the tip of the left coronary cusp, no significant stenosis, trivial AI, moderate TR, RVSP 53 (moderate pulmonary hypertension), diastolic dysfunction with indeterminate, but likely elevated LV Filling pressure.  EKG  Sinus or ectopic atrial rhythm rate 55 beats per minute.  For complete results please see formal report.   Dg Chest Port 1 View 10/03/2013 Left lower lobe consolidation. Stable cardiomegaly. No pneumothorax. 09/29/2013   Increasing opacity at the right lung base consistent with atelectasis versus developing pneumonia.  09/27/2013   Tube and catheter positions as described without pneumothorax. Left base atelectasis. Elsewhere lungs clear.    09/26/2013   1. Endotracheal tube seen ending 3-4 cm above the carina. 2. Left subclavian line seen ending about the distal SVC. 3. Enteric tube noted extending below the diaphragm, though the side port is seen ending about the distal  esophagus. 4. No acute cardiopulmonary process seen; borderline cardiomegaly noted.     ABD portable 10/02/2013 Feeding tube tip in stomach. Suspect a degree of colonic ileus. The soft tissue mass in the left pelvic region noted on CT is also apparent on radiographic examination.  09/30/2013 Feeding tube is in place with the tip in the stomach. 09/30/2013 1. Feeding tube looped in the distal esophagus.  CT abdomen & pelvix 10/02/2013  Mild bilateral pleural effusions are noted with adjacent subsegmental atelectasis. Mild to moderate anasarca is noted in the subcutaneous tissues of the abdomen and pelvis. Large hematoma seen in the left anterior abdominal wall. Also noted is 4.3 cm hyperdensity in the pelvis anterior to the uterus concerning for blood clot. Alternatively, it potentially may represent mass. Further evaluation with ultrasound or MRI is recommended.  Therapy Recommendations  SNF     Physical exam General - thin, well developed, african [place] lady not in distress.   Ophthalmologic - not cooperative on exam.  Cardiovascular - Regular rate and rhythm with no murmur.  Respiratory - bilateral coarse rhonchi and decreased breath sounds.  Extremities - right LE BKA, Left LE wound at left shin and left groin hematoma much improved with left flank ecchymosis.   NEUROLOGIC:  lethargic, open eyes on voice, globally aphasic and mumbles words but unintelligible, did not follow any commands except for sticking out her tongue. PERRL, sluggish to light reaction, left gaze and right side neglect, positive corneal and gag, left UE spontaneously movement, but right UE 1-2/5 on pain stimulation, b/l LEs withdraw to pain and against gravity. Left babinski negative, right BKA. Reflex 1+ throughout.  ASSESSMENT Ms. Victoria West is a 80 y.o. female presenting with aphasia and right hemiparesis . TPA was not administered secondary to late presentation. Status post left common carotid arteriogram,  followed by mechanical thrombolysis of occluded inf division of LT MCA using a microguidewire - Dr Corliss Skains 09/26/2013 1:11 AM. Head CT revealed no acute findings. MRI confirmed left MCA stroke and M2 cut off.  On aspirin 81 mg orally every day prior to admission. Now on no antithrombotics or anticoagulation for secondary stroke prevention secondary to left groin hematoma and subsequent anemia. Patient with resultant right hemiplegia, dysphagia.   Cardiac arrest, resolved  Hyperlipidemia  - Cholesterol 136; LDL 90 - Pravachol prior to admission, resume after po assess  Hypertension history  Dementia  Intubated on ventilator - extubated  Diabetes mellitus - hemoglobin A1c 6.5  Renal insufficiency  Low calcium level, resolved  Right BKA  Anemia - due to groin hematoma and later GIB, requiring blood transfusion.  PLAN Stroke - MRI confirmed left MCA stroke with M2 cut off.  - repeat CT  showed evolving left MCA stroke, no midline shift. - discontinued ASA due to low Hb requiring transfusion. May consider to resume once Hb stable. - discontinued heparin subq for DVT prophylaxis dropping Hb requiring transfusion. May consider to resume once hb stable. - SCDs not ordered secondary to right BKA and no palpable pulse left lower extremity. - Anemia - Hb stable at 11 after 2 PRBC transfusion. Continue to monitor  - Hypokalemia - resolved, this am 4.4. Will give oral supplement - palliative care on board - DO NOT RESUSCITATE - G tube placed by Trauma 10/06/2013. Removed by patient during the night. For attempted replacement today.  Respiratory failure - resolved  Anemia/black stook - Hb stable at 11.2 - transfused during hospitalization - stool guaiac neg x 2 - initially placed on protonix drip, changed to protonix  bid   HTN - initially hypotension on dopamine, later cardene drip for HTN - cardene drip restarted for BP > 170, only on for a couple of hours prior to being  turned off - on lisinopril, amlodipine, coreg, hydralazine - BP 149-205/66-85 past 24h   - no hydralazine past 24h - SBP goal  < 200  Left Groin hematoma - Hematoma and left femoral artery puncture site, requiring transfusion - hematoma resided  - no active bleeding - [place] and CT showed large hematoma tracking to abdominal wall - close monitor H&H  Pressure Wounds L tibial, L heel, sacrum (stage 2) Wound RN recommendations    Annie Main, MSN, RN, ANVP-BC, ANP-BC, GNP-BC [facility] Pager: [phone] 10/07/2013 10:43 AM I have personally examined this patient,  reviewed notes, independently viewed imaging studies, participated in medical decision making and plan of care. I have made any additions or clarifications directly to the above note. Agree with note above.   Delia Heady, MD Medical Director [facility] Pager: [phone] 10/07/2013 4:52 PM   To contact Stroke Continuity provider, please refer to [email or web address]. After hours, contact General Neurology

## 2013-10-07 NOTE — Progress Notes (Signed)
Patient XB:MWUXL Victoria West      DOB: 01-05-1925      KGM:[id number]  Patient in Endoscopy for replacement of PEG. No family in room.  Received call from [facility] at [facility].  Patient's son Victoria West will be able to visit later today.  Staff updated and communicated with primary team. Minimal information shared with chaplain to help prepare son for patient's condition.  I will ask our Chaplain's to be available to help with emotional support.  This visit was endorsed by the patient's brother, who requested that we assist in arranging this if possible.  The visit itself was not conveyed to family for security reasons.   Cheri Ayotte L. Ladona Ridgel, MD MBA The Palliative Medicine Team at [facility] Phone: [phone] Pager: [phone] ( Use team phone after hours)

## 2013-10-07 NOTE — Op Note (Signed)
OPERATIVE REPORT  DATE OF OPERATION: 09/25/2013 - 10/07/2013  PATIENT:  Victoria West  80 y.o. female  PRE-OPERATIVE DIAGNOSIS:  Dislodgement of previously placed Gastrostomy tube  POST-OPERATIVE DIAGNOSIS:  Same  PROCEDURE:  Procedure(s): ESOPHAGOGASTRODUODENOSCOPY (EGD) PERCUTANEOUS ENDOSCOPIC GASTROSTOMY (PEG) PLACEMENT  SURGEON:  Surgeon(s): Cherylynn Ridges, MD  ASSISTANTLeotis Shames, PA-C  ANESTHESIA:   IV sedation  EBL: <20 ml  BLOOD ADMINISTERED: none  DRAINS: Gastrostomy Tube   SPECIMEN:  No Specimen  COUNTS CORRECT:  YES  PROCEDURE DETAILS: The patient had had a gastrostomy tube placed percutaneously yesterday in the endoscopy suite. This morning, that gastrostomy tube was dislodged and pulled out. In spite of that the patient had no evidence of peritonitis and it seems as though the previous gastrostomy hole had sealed and was not bleeding into the peritoneal cavity. Therefore the decision was made to try to replace gastrostomy tube through the previous site.  This procedure was performed in the endoscopy suite. A proper time out was performed identifying the patient and the procedure to be performed. We used a Pentax [id number] endoscope in order to cannulate the oropharynx and follow a previously placed [place] sump into the esophagus and the stomach. Once we entered the stomach we were able to insufflate the stomach with air and we were able to identify the previous site of the gastrostomy tube.  Through the old incision on the anterior abdominal wall an attempt was made to cannulate the previous gastrotomy site. This not successful therefore we wanted to a new site just adjacent to the previous gastrostomy tube. We used a push type gastrostomy tube this time. After the Angiocath was passed into the stomach under direct vision a single green wire was passed through the Angiocath and grabbed using this narrowing pulled out of the patient's mouth. We then passed the push through the  gastrostomy tube over the wire completely bringing about the back end of the gastrostomy tube. We then with some tension past the gastrostomy tube over the wire through the patient's mouth out to the anterior abdominal wall pulling up snugly. An endoscope was used to identify and confirm placement.  Once this was done it was secured in place in the usual sterile manner with a mesentery had an abdominal binder also. The patient seemed to tolerate procedure well with IV sedation with fentanyl and Versed. During the procedure there did not appear to be a lot of gas was seen to escape through the previous gastrostomy tube site. We will leave the gastrostomy to gravity drainage for 48 hours then prior to restarting her tube feedings we will possibly get a contrast study.  All counts were correct.      PATIENT DISPOSITION:  PACU - hemodynamically stable.   Cherylynn Ridges 8/27/20151:32 PM

## 2013-10-07 NOTE — Progress Notes (Signed)
Pt started on tube feeding as ordered, but she pulled the peg tube out even though she had the abdominal binder on. MD notified, no orders at the moment. Mitten placed on pt's left hand. Endorsed to on coming nurse.

## 2013-10-07 NOTE — Progress Notes (Signed)
Physical Therapy Treatment Patient Details Name: Victoria West MRN: [id number] DOB: 09/06/1924 Today's Date: 10/07/2013    History of Present Illness 80 yo resident of [facility] Burn with R BKA who suffered L MCA CVA    PT Comments    Having trouble attending to task or following direction without tactile/guiding cues.  Worked on trunk activation and balance at EOB.  Follow Up Recommendations  SNF;Supervision/Assistance - 24 hour     Equipment Recommendations  None recommended by PT    Recommendations for Other Services       Precautions / Restrictions Precautions Precautions: Fall Restrictions Weight Bearing Restrictions: No    Mobility  Bed Mobility Overal bed mobility: Needs Assistance;+2 for physical assistance Bed Mobility: Supine to Sit;Sit to Supine     Supine to sit: Total assist (x2) Sit to supine: Total assist (x2)   General bed mobility comments: pt moving L LE voluntarily, but not purposefully to assist with getting OOB.  Again had to use pad to pivot around to EOB.   Significant truncal assist at EOB which makes +2 assist necessary, though I did not have available  Transfers                    Ambulation/Gait                 Stairs            Wheelchair Mobility    Modified Rankin (Stroke Patients Only) Modified Rankin (Stroke Patients Only) Pre-Morbid Rankin Score: Moderately severe disability Modified Rankin: Severe disability     Balance Overall balance assessment: Needs assistance Sitting-balance support: Feet supported;No upper extremity supported;Single extremity supported Sitting balance-Leahy Scale: Zero Sitting balance - Comments: Total assist initially, but once foundation attained and pushing inhibited, pt was maximal assist.  Sat EOB x2 for  10 minutes total..  Pt pusihing R with L UE and LE, but not resisting efforts to realign her to vertical.  Worked on general balance propped on each elbow. Postural control: Right  lateral lean                          Cognition Arousal/Alertness: Lethargic Behavior During Therapy: Flat affect Overall Cognitive Status: Difficult to assess Area of Impairment: Following commands       Following Commands:  (not following commands today)            Exercises Other Exercises Other Exercises: Passively mobilized the R side.    General Comments        Pertinent Vitals/Pain Pain Assessment: Faces Faces Pain Scale: Hurts even more Pain Location: leg Pain Intervention(s): Repositioned    Home Living                      Prior Function            PT Goals (current goals can now be found in the care plan section) Acute Rehab PT Goals Patient Stated Goal: none stated PT Goal Formulation: With patient/family Time For Goal Achievement: 10/19/13 Potential to Achieve Goals: Fair Progress towards PT goals: Progressing toward goals    Frequency  Min 2X/week    PT Plan Current plan remains appropriate    Co-evaluation             End of Session   Activity Tolerance: Patient limited by fatigue (also elevated BP's) Patient left: with call bell/phone within reach;in bed  Time: 1610-9604 PT Time Calculation (min): 27 min  Charges:  $Therapeutic Activity: 23-37 mins                    G Codes:      Oluwadara Gorman, Eliseo Gum 10/07/2013, 3:49 PM 10/07/2013  Wray Bing, PT [phone]  (pager)

## 2013-10-08 ENCOUNTER — Encounter (HOSPITAL_COMMUNITY): Payer: Self-pay | Admitting: General Surgery

## 2013-10-08 LAB — BASIC METABOLIC PANEL
Anion gap: 12 (ref 5–15)
BUN: 22 mg/dL (ref 6–23)
CO2: 20 mEq/L (ref 19–32)
Calcium: 8.2 mg/dL — ABNORMAL LOW (ref 8.4–10.5)
Chloride: 112 mEq/L (ref 96–112)
Creatinine, Ser: 0.61 mg/dL (ref 0.50–1.10)
GFR calc Af Amer: >90 mL/min (ref >90)
GFR, EST NON AFRICAN AMERICAN: 79 mL/min — AB (ref >90)
Glucose, Bld: 96 mg/dL (ref 70–99)
POTASSIUM: 3.3 meq/L — AB (ref 3.7–5.3)
Sodium: 144 mEq/L (ref 137–147)

## 2013-10-08 LAB — CBC
HEMATOCRIT: 30.9 % — AB (ref 36.0–46.0)
Hemoglobin: 10.2 g/dL — ABNORMAL LOW (ref 12.0–15.0)
MCH: 31.4 pg (ref 26.0–34.0)
MCHC: 33 g/dL (ref 30.0–36.0)
MCV: 95.1 fL (ref 78.0–100.0)
PLATELETS: 326 10*3/uL (ref 150–400)
RBC: 3.25 MIL/uL — AB (ref 3.87–5.11)
RDW: 16.9 % — ABNORMAL HIGH (ref 11.5–15.5)
WBC: 10.3 10*3/uL (ref 4.0–10.5)

## 2013-10-08 LAB — GLUCOSE, CAPILLARY
GLUCOSE-CAPILLARY: 98 mg/dL (ref 70–99)
Glucose-Capillary: 100 mg/dL — ABNORMAL HIGH (ref 70–99)
Glucose-Capillary: 123 mg/dL — ABNORMAL HIGH (ref 70–99)
Glucose-Capillary: 95 mg/dL (ref 70–99)
Glucose-Capillary: 99 mg/dL (ref 70–99)

## 2013-10-08 MED ORDER — HYDRALAZINE HCL 20 MG/ML IJ SOLN
10.0000 mg | INTRAMUSCULAR | Status: DC | PRN
  Administered 2013-10-08 – 2013-10-09 (×3): 20 mg via INTRAVENOUS
  Administered 2013-10-09: 40 mg via INTRAVENOUS
  Filled 2013-10-08: qty 1
  Filled 2013-10-08: qty 2
  Filled 2013-10-08 (×2): qty 1

## 2013-10-08 NOTE — Progress Notes (Signed)
Stroke Team Progress Note  HISTORY Victoria West is an 80 y.o. female with a history of hypertension, hypothyroidism, glaucoma and dementia who was brought to the emergency room 09/25/2013 after being found unable to speak as well as unable to move her right side and gaze to the left side. There is no previous history of stroke. CT scan of her head showed no acute intracranial abnormality. She was last seen well at 1700 today. She was beyond time window for intervention with TPA when she arrived in the emergency room. NIH stroke score was 24. S/P Left common carotid arteriogram,followed by mechanical thrombolysis of occluded inf division of LT MCA using a microguidewire - Dr Corliss Skains 09/26/2013 1:11 AM  SUBJECTIVE PEG replaced by trauma.  OBJECTIVE Most recent Vital Signs: Filed Vitals:   10/08/13 0500 10/08/13 0616 10/08/13 0827 10/08/13 1007  BP:  223/59 201/67 189/65  Pulse:  70  72  Temp:  97.6 F (36.4 C)  98 F (36.7 C)  TempSrc:  Oral  Oral  Resp:  18  20  Height:      Weight: 59.421 kg (131 lb)     SpO2:  100%  100%   CBG (last 3)   Recent Labs  10/08/13 0005 10/08/13 0405 10/08/13 0814  GLUCAP 100* 98 99    IV Fluid Intake:   . sodium chloride 75 mL/hr at 10/07/13 2155    MEDICATIONS  . amLODipine  10 mg Per Tube Daily  . antiseptic oral rinse  7 mL Mouth Rinse QID  . bacitracin   Topical Daily  . carvedilol  6.25 mg Per Tube BID WC  . chlorhexidine  15 mL Mouth Rinse BID  . hydrALAZINE  100 mg Oral 3 times per day  . insulin aspart  0-15 Units Subcutaneous 6 times per day  . lisinopril  20 mg Per Tube BID  . pantoprazole  40 mg Oral Daily   PRN:  acetaminophen (TYLENOL) oral liquid 160 mg/5 mL, albuterol, hydrALAZINE, metoprolol, ondansetron (ZOFRAN) IV  Diet:  NPO  Activity:   DVT Prophylaxis:  Pharmacological VTE prophy on hold for PEG; unable to place SCDs due to leg wounds  CLINICALLY SIGNIFICANT STUDIES Basic Metabolic Panel:   Recent Labs Lab  10/07/13 0728 10/08/13 0729  NA 144 144  K 3.6* 3.3*  CL 110 112  CO2 21 20  GLUCOSE 118* 96  BUN 29* 22  CREATININE 0.60 0.61  CALCIUM 8.3* 8.2*   Liver Function Tests:  No results found for this basename: AST, ALT, ALKPHOS, BILITOT, PROT, ALBUMIN,  in the last 168 hours CBC:   Recent Labs Lab 10/07/13 0728 10/08/13 0729  WBC 11.0* 10.3  HGB 11.2* 10.2*  HCT 34.9* 30.9*  MCV 95.4 95.1  PLT 363 326   Coagulation:  No results found for this basename: LABPROT, INR,  in the last 168 hours Cardiac Enzymes:  No results found for this basename: CKTOTAL, CKMB, CKMBINDEX, TROPONINI,  in the last 168 hours Urinalysis: No results found for this basename: COLORURINE, APPERANCEUR, LABSPEC, PHURINE, GLUCOSEU, HGBUR, BILIRUBINUR, KETONESUR, PROTEINUR, UROBILINOGEN, NITRITE, LEUKOCYTESUR,  in the last 168 hours Lipid Panel    Component Value Date/Time   CHOL 136 09/26/2013 0330   TRIG 102 09/26/2013 0330   HDL 26* 09/26/2013 0330   CHOLHDL 5.2 09/26/2013 0330   VLDL 20 09/26/2013 0330   LDLCALC 90 09/26/2013 0330   HgbA1C  Lab Results  Component Value Date   HGBA1C 6.5* 09/26/2013    Urine  Drug Screen:   No results found for this basename: labopia,  cocainscrnur,  labbenz,  amphetmu,  thcu,  labbarb    Alcohol Level: No results found for this basename: ETH,  in the last 168 hours  Occult blood in stool - negative.  Ct Head Wo Contrast 10/02/13  Typical evolution of left posterior MCA infarct without hemorrhage. No new findings.  09/26/2013    No acute intracranial abnormality. Atrophy, chronic small vessel disease.    09/25/2013    1. No acute intracranial pathology seen on CT. 2. Mild to moderate cortical volume loss and diffuse small vessel ischemic microangiopathy.    MRI/MRA of the brain  09/27/2013 Acute infarct in the posterior portion of the left MCA territory. Small area of acute infarct in the right medial occipital lobe. Severe stenosis distal left M1 segment with occlusion  of the posterior segment of the left middle cerebral artery corresponding to acute infarct. Decreased flow in the anterior division of the left middle cerebral artery.  Carotid Doppler  Bilateral: 1-39% ICA stenosis. Vertebral artery flow is antegrade.  [place] left LE 10/01/2013 Large subcutaneous collection compatible with hematoma, tracking from the left groin up the left abdominal wall. No active hemorrhage identified on Doppler interrogation  2D Echocardiogram  - LVEF 70-75%, severe concentric LVH, trileaflet calcified aortic valve with calcified mass on non-coronary cusp and calcification of the tip of the left coronary cusp, no significant stenosis, trivial AI, moderate TR, RVSP 53 (moderate pulmonary hypertension), diastolic dysfunction with indeterminate, but likely elevated LV Filling pressure.  EKG  Sinus or ectopic atrial rhythm rate 55 beats per minute.  For complete results please see formal report.   Dg Chest Port 1 View 10/03/2013 Left lower lobe consolidation. Stable cardiomegaly. No pneumothorax. 09/29/2013   Increasing opacity at the right lung base consistent with atelectasis versus developing pneumonia.  09/27/2013   Tube and catheter positions as described without pneumothorax. Left base atelectasis. Elsewhere lungs clear.    09/26/2013   1. Endotracheal tube seen ending 3-4 cm above the carina. 2. Left subclavian line seen ending about the distal SVC. 3. Enteric tube noted extending below the diaphragm, though the side port is seen ending about the distal esophagus. 4. No acute cardiopulmonary process seen; borderline cardiomegaly noted.     ABD portable 10/02/2013 Feeding tube tip in stomach. Suspect a degree of colonic ileus. The soft tissue mass in the left pelvic region noted on CT is also apparent on radiographic examination.  09/30/2013 Feeding tube is in place with the tip in the stomach. 09/30/2013 1. Feeding tube looped in the distal esophagus.  CT abdomen & pelvix 10/02/2013   Mild bilateral pleural effusions are noted with adjacent subsegmental atelectasis. Mild to moderate anasarca is noted in the subcutaneous tissues of the abdomen and pelvis. Large hematoma seen in the left anterior abdominal wall. Also noted is 4.3 cm hyperdensity in the pelvis anterior to the uterus concerning for blood clot. Alternatively, it potentially may represent mass. Further evaluation with ultrasound or MRI is recommended.   Therapy Recommendations  SNF     Physical exam General - thin, well developed, african [place] lady not in distress.   Ophthalmologic - not cooperative on exam.  Cardiovascular - Regular rate and rhythm with no murmur.  Respiratory - bilateral coarse rhonchi and decreased breath sounds.  Extremities - right LE BKA, Left LE wound at left shin and left groin hematoma much improved with left flank ecchymosis.   NEUROLOGIC:  lethargic,  open eyes on voice, globally aphasic and mumbles words but unintelligible, did not follow any commands except for sticking out her tongue. PERRL, sluggish to light reaction, left gaze and right side neglect, positive corneal and gag, left UE spontaneously movement, but right UE 1-2/5 on pain stimulation, b/l LEs withdraw to pain and against gravity. Left babinski negative, right BKA. Reflex 1+ throughout.  ASSESSMENT Ms. Victoria West is a 80 y.o. female presenting with aphasia and right hemiparesis . TPA was not administered secondary to late presentation. Status post left common carotid arteriogram, followed by mechanical thrombolysis of occluded inf division of LT MCA using a microguidewire - Dr Corliss Skains 09/26/2013 1:11 AM. Head CT revealed no acute findings. MRI confirmed left MCA stroke and M2 cut off.  On aspirin 81 mg orally every day prior to admission. Now on no antithrombotics or anticoagulation for secondary stroke prevention secondary to left groin hematoma and subsequent anemia. Patient with resultant right hemiplegia,  dysphagia.   Cardiac arrest, resolved  Hyperlipidemia  - Cholesterol 136; LDL 90 - Pravachol prior to admission, resume after po assess  Hypertension history  Dementia  Respiratory Failure, Intubated for intervention, unable to protect airway following, now extubated  Diabetes mellitus - hemoglobin A1c 6.5  Renal insufficiency  Low calcium level, resolved  Right BKA  HTN - initially hypotension on dopamine, later cardene drip for HTN - cardene drip restarted for BP > 170, only on for a couple of hours prior to being turned off - on lisinopril, amlodipine, coreg, hydralazine - BP evelated, receiving hydralazine for goal < 170 per RN. Goal changed previously to < 200. Will alert RN and hope to clarify order - po meds on hold until PEG use confirmed Sat after contrast study  Anemia/black stool - due to groin hematoma, requiring blood transfusion. - Hb stable at 11.2 - transfused during hospitalization - stool guaiac neg x 2 - initially placed on protonix drip, changed to protonix  bid   Left Groin hematoma - Hematoma and left femoral artery puncture site, requiring transfusion - hematoma resided  - no active bleeding - [place] and CT showed large hematoma tracking to abdominal wall - close monitor H&H  Pressure Wounds L tibial, L heel, sacrum (stage 2) Wound RN recommendations   PLAN Stroke - MRI confirmed left MCA stroke with M2 cut off.  - repeat CT  showed evolving left MCA stroke, no midline shift. - discontinued ASA due to low Hb requiring transfusion. May consider to resume once Hb stable. - discontinued heparin subq for DVT prophylaxis dropping Hb requiring transfusion. May consider to resume once hb stable. - SCDs not ordered secondary to right BKA and no palpable pulse left lower extremity. - Anemia - Hb stable at 11 after 2 PRBC transfusion. Continue to monitor  - Hypokalemia - resolved, this am 4.4. Will give oral supplement - palliative care on board -  DO NOT RESUSCITATE - G tube placed by Trauma 10/06/2013. Removed by patient during the night. Attempted replacement same site without success. New PEG placed in an adjacent site. To drainage x 48 with plans for possible contrast study prior to resuming tube feedings.  - Palliative care following for support  Annie Main, MSN, RN, ANVP-BC, ANP-BC, GNP-BC [facility] Pager: [phone] 10/08/2013 10:46 AM  I have personally examined this patient, reviewed notes, independently viewed imaging studies, participated in medical decision making and plan of care. I have made any additions or clarifications directly to the above note. Agree  with note above.   Delia Heady, MD Medical Director [facility] Pager: [phone] 10/08/2013 8:28 PM     To contact Stroke Continuity provider, please refer to [email or web address]. After hours, contact General Neurology

## 2013-10-08 NOTE — Progress Notes (Signed)
1 Day Post-Op  Subjective: Is in no distress.  Objective: Vital signs in last 24 hours: Temp:  [97.6 F (36.4 C)-98.3 F (36.8 C)] 97.6 F (36.4 C) (08/28 0616) Pulse Rate:  [56-98] 70 (08/28 0616) Resp:  [0-24] 18 (08/28 0616) BP: (173-256)/(58-120) 201/67 mmHg (08/28 0827) SpO2:  [98 %-100 %] 100 % (08/28 0616) Weight:  [131 lb (59.421 kg)] 131 lb (59.421 kg) (08/28 0500) Last BM Date: 10/05/13  Intake/Output from previous day: 08/27 0701 - 08/28 0700 In: -  Out: 1200 [Urine:1200] Intake/Output this shift:    PE: General- In NAD Abdomen-soft, PEG in and tube feeds running.  Lab Results:   Recent Labs  10/07/13 0728 10/08/13 0729  WBC 11.0* 10.3  HGB 11.2* 10.2*  HCT 34.9* 30.9*  PLT 363 326   BMET  Recent Labs  10/07/13 0728 10/08/13 0729  NA 144 144  K 3.6* 3.3*  CL 110 112  CO2 21 20  GLUCOSE 118* 96  BUN 29* 22  CREATININE 0.60 0.61  CALCIUM 8.3* 8.2*   PT/INR No results found for this basename: LABPROT, INR,  in the last 72 hours Comprehensive Metabolic Panel:    Component Value Date/Time   NA 144 10/08/2013 0729   NA 144 10/07/2013 0728   K 3.3* 10/08/2013 0729   K 3.6* 10/07/2013 0728   CL 112 10/08/2013 0729   CL 110 10/07/2013 0728   CO2 20 10/08/2013 0729   CO2 21 10/07/2013 0728   BUN 22 10/08/2013 0729   BUN 29* 10/07/2013 0728   CREATININE 0.61 10/08/2013 0729   CREATININE 0.60 10/07/2013 0728   GLUCOSE 96 10/08/2013 0729   GLUCOSE 118* 10/07/2013 0728   CALCIUM 8.2* 10/08/2013 0729   CALCIUM 8.3* 10/07/2013 0728   AST 11 09/27/2013 0459   AST 7 09/26/2013 0330   ALT <5 09/27/2013 0459   ALT <5 09/26/2013 0330   ALKPHOS 53 09/27/2013 0459   ALKPHOS 60 09/26/2013 0330   BILITOT 0.3 09/27/2013 0459   BILITOT <0.2* 09/26/2013 0330   PROT 4.6* 09/27/2013 0459   PROT 4.1* 09/26/2013 0330   ALBUMIN 2.4* 09/27/2013 0459   ALBUMIN 2.0* 09/26/2013 0330     Studies/Results: No results found.  Anti-infectives: Anti-infectives   Start     Dose/Rate  Route Frequency Ordered Stop   10/07/13 1130  ANCEF 1 gram in 0.9% normal saline 1000 mL  Status:  Discontinued      Other  Once 10/07/13 1118 10/07/13 1123   10/07/13 1130  ceFAZolin (ANCEF) IVPB 1 g/50 mL premix     1 g 100 mL/hr over 30 Minutes Intravenous  Once 10/07/13 1124 10/07/13 1230   10/06/13 1345  ceFAZolin (ANCEF) IVPB 1 g/50 mL premix     1 g 100 mL/hr over 30 Minutes Intravenous On call 10/06/13 1342 10/06/13 1431      Assessment Active Problems:   Stroke   Hypertensive urgency   CVA (cerebral infarction)   Acute respiratory failure, unspecified whether with hypoxia or hypercapnia   Hypertension   Acute blood loss anemia   PEG placement 8/26-she pulled PEG out and it was redone on 8/27 through a different gastrotomy.    LOS: 13 days   Plan: Clamp PEG and do not use until contrast study is done tomorrow.  May use at that time if no leak is seen.   Victoria West Shela Commons 10/08/2013

## 2013-10-09 ENCOUNTER — Inpatient Hospital Stay (HOSPITAL_COMMUNITY): Payer: Medicare Other

## 2013-10-09 LAB — CBC
HEMATOCRIT: 33.1 % — AB (ref 36.0–46.0)
Hemoglobin: 10.8 g/dL — ABNORMAL LOW (ref 12.0–15.0)
MCH: 30.3 pg (ref 26.0–34.0)
MCHC: 32.6 g/dL (ref 30.0–36.0)
MCV: 92.7 fL (ref 78.0–100.0)
Platelets: 354 10*3/uL (ref 150–400)
RBC: 3.57 MIL/uL — AB (ref 3.87–5.11)
RDW: 16.4 % — ABNORMAL HIGH (ref 11.5–15.5)
WBC: 9.1 10*3/uL (ref 4.0–10.5)

## 2013-10-09 LAB — GLUCOSE, CAPILLARY
GLUCOSE-CAPILLARY: 144 mg/dL — AB (ref 70–99)
GLUCOSE-CAPILLARY: 174 mg/dL — AB (ref 70–99)
GLUCOSE-CAPILLARY: 81 mg/dL (ref 70–99)
GLUCOSE-CAPILLARY: 86 mg/dL (ref 70–99)
GLUCOSE-CAPILLARY: 97 mg/dL (ref 70–99)
Glucose-Capillary: 120 mg/dL — ABNORMAL HIGH (ref 70–99)
Glucose-Capillary: 162 mg/dL — ABNORMAL HIGH (ref 70–99)
Glucose-Capillary: 148 mg/dL — ABNORMAL HIGH (ref 70–99)

## 2013-10-09 LAB — BASIC METABOLIC PANEL
Anion gap: 13 (ref 5–15)
BUN: 19 mg/dL (ref 6–23)
CALCIUM: 8.6 mg/dL (ref 8.4–10.5)
CHLORIDE: 113 meq/L — AB (ref 96–112)
CO2: 21 meq/L (ref 19–32)
Creatinine, Ser: 0.61 mg/dL (ref 0.50–1.10)
GFR calc Af Amer: >90 mL/min (ref >90)
GFR calc non Af Amer: 79 mL/min — ABNORMAL LOW (ref >90)
GLUCOSE: 87 mg/dL (ref 70–99)
Potassium: 3.2 mEq/L — ABNORMAL LOW (ref 3.7–5.3)
Sodium: 147 mEq/L (ref 137–147)

## 2013-10-09 MED ORDER — VITAL HIGH PROTEIN PO LIQD
1000.0000 mL | ORAL | Status: DC
  Administered 2013-10-09: 1000 mL
  Filled 2013-10-09 (×2): qty 1000

## 2013-10-09 MED ORDER — OSMOLITE 1.2 CAL PO LIQD
1000.0000 mL | ORAL | Status: DC
  Administered 2013-10-09 – 2013-10-10 (×2): 1000 mL
  Filled 2013-10-09 (×5): qty 1000

## 2013-10-09 MED ORDER — PRO-STAT SUGAR FREE PO LIQD
30.0000 mL | Freq: Two times a day (BID) | ORAL | Status: DC
  Administered 2013-10-09 – 2013-10-10 (×4): 30 mL
  Administered 2013-10-11: 11:00:00
  Filled 2013-10-09 (×7): qty 30

## 2013-10-09 MED ORDER — PANTOPRAZOLE SODIUM 40 MG PO PACK
40.0000 mg | PACK | Freq: Every day | ORAL | Status: DC
  Administered 2013-10-09 – 2013-10-11 (×3): 40 mg
  Filled 2013-10-09 (×4): qty 20

## 2013-10-09 NOTE — Progress Notes (Signed)
Patient ID: Victoria West, female   DOB: 1924/05/27, 80 y.o.   MRN: [id number] 2 Days Post-Op  Subjective: mumbles  Objective: Vital signs in last 24 hours: Temp:  [97.9 F (36.6 C)-98.3 F (36.8 C)] 98.3 F (36.8 C) (08/29 0531) Pulse Rate:  [63-76] 76 (08/29 0625) Resp:  [18-20] 18 (08/29 0531) BP: (189-228)/(65-107) 220/81 mmHg (08/29 0625) SpO2:  [100 %] 100 % (08/29 0531) Weight:  [128 lb (58.06 kg)] 128 lb (58.06 kg) (08/29 0500) Last BM Date: 10/05/13  Intake/Output from previous day:   Intake/Output this shift:    abdomen soft, PEG in place, NT, binder on  Lab Results: CBC   Recent Labs  10/08/13 0729 10/09/13 0619  WBC 10.3 9.1  HGB 10.2* 10.8*  HCT 30.9* 33.1*  PLT 326 354   BMET  Recent Labs  10/08/13 0729 10/09/13 0619  NA 144 147  K 3.3* 3.2*  CL 112 113*  CO2 20 21  GLUCOSE 96 87  BUN 22 19  CREATININE 0.61 0.61  CALCIUM 8.2* 8.6   PT/INR No results found for this basename: LABPROT, INR,  in the last 72 hours ABG No results found for this basename: PHART, PCO2, PO2, HCO3,  in the last 72 hours  Studies/Results: Dg Abd 1 View  10/09/2013   CLINICAL DATA:  Replaced PEG tube.  EXAM: ABDOMEN - 1 VIEW  COMPARISON:  10/02/2013.  FINDINGS: Gastrostomy tube noted. Contrast in the a gastrostomy tube in stomach is noted. Mild distention small and large bowel noted since with adynamic ileus.  IMPRESSION: 1. Gastrostomy tube in stomach noted. Gastrostomy tube and stomach contain contrast. No gastric distention .  2.  Adynamic ileus.   Electronically Signed   By: Maisie Fus  Register   On: 10/09/2013 09:31    Anti-infectives: Anti-infectives   Start     Dose/Rate Route Frequency Ordered Stop   10/07/13 1130  ANCEF 1 gram in 0.9% normal saline 1000 mL  Status:  Discontinued      Other  Once 10/07/13 1118 10/07/13 1123   10/07/13 1130  ceFAZolin (ANCEF) IVPB 1 g/50 mL premix     1 g 100 mL/hr over 30 Minutes Intravenous  Once 10/07/13 1124 10/07/13 1230   10/06/13 1345  ceFAZolin (ANCEF) IVPB 1 g/50 mL premix     1 g 100 mL/hr over 30 Minutes Intravenous On call 10/06/13 1342 10/06/13 1431      Assessment/Plan: s/p Procedure(s): ESOPHAGOGASTRODUODENOSCOPY (EGD) PERCUTANEOUS ENDOSCOPIC GASTROSTOMY (PEG) PLACEMENT PEG study with good position and no leak - resume TF  LOS: 14 days    Violeta Gelinas, MD, MPH, FACS Trauma: [id number] General Surgery: [phone]  10/09/2013

## 2013-10-09 NOTE — Progress Notes (Signed)
Stroke Team Progress Note  HISTORY Victoria West is an 80 y.o. female with a history of hypertension, hypothyroidism, glaucoma and dementia who was brought to the emergency room 09/25/2013 after being found unable to speak as well as unable to move her right side and gaze to the left side. There is no previous history of stroke. CT scan of her head showed no acute intracranial abnormality. She was last seen well at 1700 today. She was beyond time window for intervention with TPA when she arrived in the emergency room. NIH stroke score was 24. S/P Left common carotid arteriogram,followed by mechanical thrombolysis of occluded inf division of LT MCA using a microguidewire - Dr Corliss Skains 09/26/2013 1:11 AM  SUBJECTIVE No meaningful communication. Nurse concerned about left hip edema / deformity / pain. Will xray.  OBJECTIVE Most recent Vital Signs: Filed Vitals:   10/09/13 0216 10/09/13 0500 10/09/13 0531 10/09/13 0625  BP: 197/107  224/76 220/81  Pulse: 68  63 76  Temp: 98.1 F (36.7 C)  98.3 F (36.8 C)   TempSrc: Oral  Oral   Resp: 20  18   Height:      Weight:  128 lb (58.06 kg)    SpO2: 100%  100%    CBG (last 3)   Recent Labs  10/09/13 0023 10/09/13 0441 10/09/13 0810  GLUCAP 97 86 81    IV Fluid Intake:   . sodium chloride 75 mL/hr at 10/08/13 2343    MEDICATIONS  . amLODipine  10 mg Per Tube Daily  . antiseptic oral rinse  7 mL Mouth Rinse QID  . bacitracin   Topical Daily  . carvedilol  6.25 mg Per Tube BID WC  . chlorhexidine  15 mL Mouth Rinse BID  . hydrALAZINE  100 mg Oral 3 times per day  . insulin aspart  0-15 Units Subcutaneous 6 times per day  . lisinopril  20 mg Per Tube BID  . pantoprazole  40 mg Oral Daily   PRN:  acetaminophen (TYLENOL) oral liquid 160 mg/5 mL, albuterol, hydrALAZINE, metoprolol, ondansetron (ZOFRAN) IV  Diet:  NPO  Activity:   DVT Prophylaxis:  Pharmacological VTE prophy on hold for PEG; unable to place SCDs due to leg  wounds  CLINICALLY SIGNIFICANT STUDIES Basic Metabolic Panel:   Recent Labs Lab 10/08/13 0729 10/09/13 0619  NA 144 147  K 3.3* 3.2*  CL 112 113*  CO2 20 21  GLUCOSE 96 87  BUN 22 19  CREATININE 0.61 0.61  CALCIUM 8.2* 8.6   Liver Function Tests:  No results found for this basename: AST, ALT, ALKPHOS, BILITOT, PROT, ALBUMIN,  in the last 168 hours CBC:   Recent Labs Lab 10/08/13 0729 10/09/13 0619  WBC 10.3 9.1  HGB 10.2* 10.8*  HCT 30.9* 33.1*  MCV 95.1 92.7  PLT 326 354   Coagulation:  No results found for this basename: LABPROT, INR,  in the last 168 hours Cardiac Enzymes:  No results found for this basename: CKTOTAL, CKMB, CKMBINDEX, TROPONINI,  in the last 168 hours Urinalysis: No results found for this basename: COLORURINE, APPERANCEUR, LABSPEC, PHURINE, GLUCOSEU, HGBUR, BILIRUBINUR, KETONESUR, PROTEINUR, UROBILINOGEN, NITRITE, LEUKOCYTESUR,  in the last 168 hours Lipid Panel    Component Value Date/Time   CHOL 136 09/26/2013 0330   TRIG 102 09/26/2013 0330   HDL 26* 09/26/2013 0330   CHOLHDL 5.2 09/26/2013 0330   VLDL 20 09/26/2013 0330   LDLCALC 90 09/26/2013 0330   HgbA1C  Lab Results  Component  Value Date   HGBA1C 6.5* 09/26/2013    Urine Drug Screen:   No results found for this basename: labopia,  cocainscrnur,  labbenz,  amphetmu,  thcu,  labbarb    Alcohol Level: No results found for this basename: ETH,  in the last 168 hours  Occult blood in stool - negative.  Ct Head Wo Contrast 10/02/13  Typical evolution of left posterior MCA infarct without hemorrhage. No new findings.  09/26/2013    No acute intracranial abnormality. Atrophy, chronic small vessel disease.    09/25/2013    1. No acute intracranial pathology seen on CT. 2. Mild to moderate cortical volume loss and diffuse small vessel ischemic microangiopathy.    MRI/MRA of the brain  09/27/2013 Acute infarct in the posterior portion of the left MCA territory. Small area of acute infarct in the  right medial occipital lobe. Severe stenosis distal left M1 segment with occlusion of the posterior segment of the left middle cerebral artery corresponding to acute infarct. Decreased flow in the anterior division of the left middle cerebral artery.  Carotid Doppler  Bilateral: 1-39% ICA stenosis. Vertebral artery flow is antegrade.  [place] left LE 10/01/2013 Large subcutaneous collection compatible with hematoma, tracking from the left groin up the left abdominal wall. No active hemorrhage identified on Doppler interrogation  2D Echocardiogram  - LVEF 70-75%, severe concentric LVH, trileaflet calcified aortic valve with calcified mass on non-coronary cusp and calcification of the tip of the left coronary cusp, no significant stenosis, trivial AI, moderate TR, RVSP 53 (moderate pulmonary hypertension), diastolic dysfunction with indeterminate, but likely elevated LV Filling pressure.  EKG  Sinus or ectopic atrial rhythm rate 55 beats per minute.  For complete results please see formal report.   Dg Chest Port 1 View 10/03/2013 Left lower lobe consolidation. Stable cardiomegaly. No pneumothorax. 09/29/2013   Increasing opacity at the right lung base consistent with atelectasis versus developing pneumonia.  09/27/2013   Tube and catheter positions as described without pneumothorax. Left base atelectasis. Elsewhere lungs clear.    09/26/2013   1. Endotracheal tube seen ending 3-4 cm above the carina. 2. Left subclavian line seen ending about the distal SVC. 3. Enteric tube noted extending below the diaphragm, though the side port is seen ending about the distal esophagus. 4. No acute cardiopulmonary process seen; borderline cardiomegaly noted.     ABD portable 10/02/2013 Feeding tube tip in stomach. Suspect a degree of colonic ileus. The soft tissue mass in the left pelvic region noted on CT is also apparent on radiographic examination.  09/30/2013 Feeding tube is in place with the tip in the stomach. 09/30/2013  1. Feeding tube looped in the distal esophagus.  CT abdomen & pelvix 10/02/2013  Mild bilateral pleural effusions are noted with adjacent subsegmental atelectasis. Mild to moderate anasarca is noted in the subcutaneous tissues of the abdomen and pelvis. Large hematoma seen in the left anterior abdominal wall. Also noted is 4.3 cm hyperdensity in the pelvis anterior to the uterus concerning for blood clot. Alternatively, it potentially may represent mass. Further evaluation with ultrasound or MRI is recommended.   Therapy Recommendations  SNF     Physical exam General - thin, well developed, african [place] lady not in distress.   Ophthalmologic - not cooperative on exam.  Cardiovascular - Regular rate and rhythm with no murmur.  Respiratory - bilateral coarse rhonchi and decreased breath sounds.  Extremities - right LE BKA, Left LE wound at left shin and left groin hematoma  much improved with left flank ecchymosis.   NEUROLOGIC:  lethargic, open eyes on voice, globally aphasic and mumbles words but unintelligible, did not follow any commands. PERRL, sluggish to light reaction, left gaze preference and right side neglect,  R field cut; positive corneal and gag, left UE spontaneously movement, but right UE and RLU 0/5 on pain stimulation, b/l LEs withdraw to pain and against gravity. Left babinski negative, right BKA. .  ASSESSMENT Victoria West is a 80 y.o. female presenting with aphasia and right hemiparesis . TPA was not administered secondary to late presentation. Status post left common carotid arteriogram, followed by mechanical thrombolysis of occluded inf division of LT MCA using a microguidewire - Dr Corliss Skains 09/26/2013 1:11 AM. Head CT revealed no acute findings. MRI confirmed left MCA stroke and M2 cut off.  On aspirin 81 mg orally every day prior to admission. Now on no antithrombotics or anticoagulation for secondary stroke prevention secondary to left groin hematoma and subsequent  anemia. Patient with resultant right hemiplegia, dysphagia.   Cardiac arrest, resolved  Hyperlipidemia  - Cholesterol 136; LDL 90 - Pravachol prior to admission, resume after po assess  Hypertension history  Dementia  Respiratory Failure, Intubated for intervention, unable to protect airway following, now extubated  Diabetes mellitus - hemoglobin A1c 6.5  Renal insufficiency  Low calcium level, resolved  Right BKA  HTN - initially hypotension on dopamine, later cardene drip for HTN - cardene drip restarted for BP > 170, only on for a couple of hours prior to being turned off - on lisinopril, amlodipine, coreg, hydralazine - BP evelated, receiving hydralazine for goal < 170 per RN. Goal changed previously to < 200. Will alert RN and hope to clarify order - po meds on hold until PEG use confirmed Sat after contrast study  Anemia/black stool - due to groin hematoma, requiring blood transfusion. - Hb stable at 11.2 - transfused during hospitalization - stool guaiac neg x 2 - initially placed on protonix drip, changed to protonix  bid   Left Groin hematoma - Hematoma and left femoral artery puncture site, requiring transfusion - hematoma resided  - no active bleeding - [place] and CT showed large hematoma tracking to abdominal wall - close monitor H&H  Pressure Wounds L tibial, L heel, sacrum (stage 2) Wound RN recommendations   PLAN Stroke - MRI confirmed left MCA stroke with M2 cut off.  - repeat CT  showed evolving left MCA stroke, no midline shift. - Discontinued ASA due to low Hb requiring transfusion. May consider to resume once Hb stable. Check in AM. - Discontinued heparin subq for DVT prophylaxis dropping Hb requiring transfusion. May consider to resume once hb stable. - SCDs not ordered secondary to right BKA and no palpable pulse left lower extremity. - Anemia - Hb stable at 11 after 2 PRBC transfusion. Continue to monitor  - Hypokalemia - resolved, this am  4.4. Will give oral supplement - palliative care on board - DO NOT RESUSCITATE - G tube placed by Trauma 10/06/2013. Removed by patient during the night. Attempted replacement same site without success. New PEG placed in an adjacent site. Per trauma surgeon - tube feedings to be resumed. - Palliative care following for support Left hip xray.   Delton See PA-C [facility] Pager [phone] 10/09/2013, 8:18 AM  I have personally examined this patient, reviewed notes, independently viewed imaging studies, participated in medical decision making and plan of care. I have made any additions or clarifications  directly to the above note. Agree with note above.       To contact Stroke Continuity provider, please refer to [email or web address]. After hours, contact General Neurology

## 2013-10-09 NOTE — Progress Notes (Signed)
Swelling noted to patient's left hip with some discomfortfrom patient when touched. Dr. Gerilyn Pilgrim notified and placed order for left hip xray. Awaiting to be done at this time.

## 2013-10-09 NOTE — Progress Notes (Signed)
NUTRITION FOLLOW UP/CONSULT FOR TF MANAGEMENT  DOCUMENTATION CODES Per approved criteria  -Not Applicable   INTERVENTION: Start TF of Osmolite 1.2 via PEG at 49m/hr increase by 166mevery 4 hours to goal of 4042mr with Prostat 72m20mD. This will provide 1352 calories, 83g protein, and 775ml4me water and meet 100% of estimated nutritional needs Continue adult enteral protocol RD to continue to monitor   NUTRITION DIAGNOSIS: Inadequate oral intake related to inability to eat as evidenced by NPO status, ongoing  Goal: Pt to meet >/= 90% of their estimated nutrition needs, not met   Monitor:  TF regimen & tolerance, weight, labs, I/O's  ASSESSMENT: 89 y.56 Female with a history of hypertension, hypothyroidism, glaucoma and dementia who was brought to the ER after being found unable to speak as well as unable to move her right side and gaze to the left side. There is no previous history of stroke. CT scan of her head showed no acute intracranial abnormality. She was last seen well at 1700 today. She was beyond time window for intervention with TPA when she arrived in the emergency room. NIH stroke score was 24.  8/24: -Patient extubated 8/18. -Vital HP formula currently infusing at 45 ml/hr via Panda tube (placed 8/22, tip in stomach) providing 1080 kcals, 94 gm protein, 903 ml of free water.  Free water flushes at 100 ml every 8 hours. -Patient remains neurologically stable with persistent aphasia and right-sided weakness. -Palliative Care Team continuing to follow.  Noted family has decided to pursue PEG tube placement.  8/29: -PEG placed 8/26 -Pt pulled out PEG tube 8/27, replaced 8/27 -Plan is to resume TF today, RD consulted to start/manage TF   Height: Ht Readings from Last 1 Encounters:  09/26/13 _0  (1.626 m)    Weight: -----> stable Wt Readings from Last 1 Encounters:  10/09/13 128 lb (58.06 kg)    8/23  110 lb 8/22  116 lb 8/21  116 lb 8/20  119  lb 8/19  121 lb 8/18  122 lb 8/17  121 lb  BMI:  21.7 kg/m2 -- adjusted for BKA  Re-estimated Nutritional Needs: Kcal: 1200-1400 Protein: 80-95 gm Fluid: per MD  Skin:  full thickness left tibial wound  unstageable left heel wound  Diet Order: NPO  No intake or output data in the 24 hours ending 10/09/13 1110  Labs:   Recent Labs Lab 10/07/13 0728 10/08/13 0729 10/09/13 0619  NA 144 144 147  K 3.6* 3.3* 3.2*  CL 110 112 113*  CO2 _1 BUN 29* 22 19  CREATININE 0.60 0.61 0.61  CALCIUM 8.3* 8.2* 8.6  GLUCOSE 118* 96 87    CBG (last 3)   Recent Labs  10/09/13 0023 10/09/13 0441 10/09/13 0810  GLUCAP 97 86 81    Scheduled Meds: . amLODipine  10 mg Per Tube Daily  . antiseptic oral rinse  7 mL Mouth Rinse QID  . bacitracin   Topical Daily  . carvedilol  6.25 mg Per Tube BID WC  . chlorhexidine  15 mL Mouth Rinse BID  . feeding supplement (VITAL HIGH PROTEIN)  1,000 mL Per Tube Q24H  . hydrALAZINE  100 mg Oral 3 times per day  . insulin aspart  0-15 Units Subcutaneous 6 times per day  . lisinopril  20 mg Per Tube BID  . pantoprazole  40 mg Oral Daily    Continuous Infusions: . sodium chloride 75 mL/hr at 10/08/13 2343  Past Medical History  Diagnosis Date  . Stroke 2015  . Diabetes mellitus without complication   . Hypertension   . Anemia     Past Surgical History  Procedure Laterality Date  . Radiology with anesthesia N/A 09/25/2013    Procedure: RADIOLOGY WITH ANESTHESIA;  Surgeon: Rob Hickman, MD;  Location: [place];  Service: Radiology;  Laterality: N/A;  . Esophagogastroduodenoscopy N/A 10/06/2013    Procedure: ESOPHAGOGASTRODUODENOSCOPY (EGD);  Surgeon: Gwenyth Ober, MD;  Location: [facility] ENDOSCOPY;  Service: General;  Laterality: N/A;  . Peg placement N/A 10/06/2013    Procedure: PERCUTANEOUS ENDOSCOPIC GASTROSTOMY (PEG) PLACEMENT;  Surgeon: Gwenyth Ober, MD;  Location: [place];  Service: General;  Laterality: N/A;  .  Esophagogastroduodenoscopy N/A 10/07/2013    Procedure: ESOPHAGOGASTRODUODENOSCOPY (EGD);  Surgeon: Gwenyth Ober, MD;  Location: [facility] ENDOSCOPY;  Service: General;  Laterality: N/A;  . Peg placement N/A 10/07/2013    Procedure: PERCUTANEOUS ENDOSCOPIC GASTROSTOMY (PEG) PLACEMENT;  Surgeon: Gwenyth Ober, MD;  Location: [place];  Service: General;  Laterality: N/A;    Carlis Stable MS, RD, LDN [phone] Weekend/After Hours Pager

## 2013-10-10 LAB — CBC
HEMATOCRIT: 33 % — AB (ref 36.0–46.0)
Hemoglobin: 10.9 g/dL — ABNORMAL LOW (ref 12.0–15.0)
MCH: 30.7 pg (ref 26.0–34.0)
MCHC: 33 g/dL (ref 30.0–36.0)
MCV: 93 fL (ref 78.0–100.0)
PLATELETS: 298 10*3/uL (ref 150–400)
RBC: 3.55 MIL/uL — ABNORMAL LOW (ref 3.87–5.11)
RDW: 16.4 % — ABNORMAL HIGH (ref 11.5–15.5)
WBC: 8.4 10*3/uL (ref 4.0–10.5)

## 2013-10-10 LAB — BASIC METABOLIC PANEL
ANION GAP: 10 (ref 5–15)
BUN: 27 mg/dL — AB (ref 6–23)
CALCIUM: 8.1 mg/dL — AB (ref 8.4–10.5)
CHLORIDE: 112 meq/L (ref 96–112)
CO2: 21 mEq/L (ref 19–32)
CREATININE: 0.69 mg/dL (ref 0.50–1.10)
GFR calc Af Amer: 88 mL/min — ABNORMAL LOW (ref >90)
GFR calc non Af Amer: 76 mL/min — ABNORMAL LOW (ref >90)
Glucose, Bld: 195 mg/dL — ABNORMAL HIGH (ref 70–99)
Potassium: 3.1 mEq/L — ABNORMAL LOW (ref 3.7–5.3)
Sodium: 143 mEq/L (ref 137–147)

## 2013-10-10 LAB — GLUCOSE, CAPILLARY
GLUCOSE-CAPILLARY: 179 mg/dL — AB (ref 70–99)
GLUCOSE-CAPILLARY: 149 mg/dL — AB (ref 70–99)
GLUCOSE-CAPILLARY: 167 mg/dL — AB (ref 70–99)
GLUCOSE-CAPILLARY: 84 mg/dL (ref 70–99)
Glucose-Capillary: 145 mg/dL — ABNORMAL HIGH (ref 70–99)

## 2013-10-10 MED ORDER — WHITE PETROLATUM GEL
Status: AC
Start: 2013-10-10 — End: 2013-10-10
  Administered 2013-10-10: 0.2
  Filled 2013-10-10: qty 5

## 2013-10-10 NOTE — Progress Notes (Signed)
Stroke Team Progress Note  HISTORY Victoria West is an 80 y.o. female with a history of hypertension, hypothyroidism, glaucoma and dementia who was brought to the emergency room 09/25/2013 after being found unable to speak as well as unable to move her right side and gaze to the left side. There is no previous history of stroke. CT scan of her head showed no acute intracranial abnormality. She was last seen well at 1700 today. She was beyond time window for intervention with TPA when she arrived in the emergency room. NIH stroke score was 24. S/P Left common carotid arteriogram,followed by mechanical thrombolysis of occluded inf division of LT MCA using a microguidewire - Dr Corliss Skains 09/26/2013 1:11 AM  SUBJECTIVE No meaningful communication. Nurse concerned about left hip edema / deformity / pain. X RAY ok but poor quality.   OBJECTIVE Most recent Vital Signs: Filed Vitals:   10/09/13 1835 10/09/13 2137 10/10/13 0113 10/10/13 0510  BP: 167/60 180/66 178/62 174/78  Pulse: 62 63 52 54  Temp: 97.9 F (36.6 C) 98.6 F (37 C) 98 F (36.7 C) 97.8 F (36.6 C)  TempSrc: Axillary Axillary Axillary Axillary  Resp: Height:      Weight:    134 lb 9.6 oz (61.054 kg)  SpO2: 99% 100% 98% 99%   CBG (last 3)   Recent Labs  10/09/13 2355 10/10/13 0353 10/10/13 0759  GLUCAP 174* 179* 149*    IV Fluid Intake:   . sodium chloride 75 mL/hr at 10/08/13 2343  . feeding supplement (OSMOLITE 1.2 CAL) 1,000 mL (10/09/13 2050)    MEDICATIONS  . amLODipine  10 mg Per Tube Daily  . antiseptic oral rinse  7 mL Mouth Rinse QID  . bacitracin   Topical Daily  . carvedilol  6.25 mg Per Tube BID WC  . chlorhexidine  15 mL Mouth Rinse BID  . feeding supplement (PRO-STAT SUGAR FREE 64)  30 mL Per Tube q12n4p  . hydrALAZINE  100 mg Oral 3 times per day  . insulin aspart  0-15 Units Subcutaneous 6 times per day  . lisinopril  20 mg Per Tube BID  . pantoprazole sodium  40 mg Per Tube Daily   PRN:   acetaminophen (TYLENOL) oral liquid 160 mg/5 mL, albuterol, hydrALAZINE, metoprolol, ondansetron (ZOFRAN) IV  Diet:  NPO  Activity:   DVT Prophylaxis:  Pharmacological VTE prophy on hold for PEG; unable to place SCDs due to leg wounds  CLINICALLY SIGNIFICANT STUDIES Basic Metabolic Panel:   Recent Labs Lab 10/09/13 0619 10/10/13 0354  NA 147 143  K 3.2* 3.1*  CL 113* 112  CO2 21 21  GLUCOSE 87 195*  BUN 19 27*  CREATININE 0.61 0.69  CALCIUM 8.6 8.1*   Liver Function Tests:  No results found for this basename: AST, ALT, ALKPHOS, BILITOT, PROT, ALBUMIN,  in the last 168 hours CBC:   Recent Labs Lab 10/09/13 0619 10/10/13 0354  WBC 9.1 8.4  HGB 10.8* 10.9*  HCT 33.1* 33.0*  MCV 92.7 93.0  PLT 354 298   Coagulation:  No results found for this basename: LABPROT, INR,  in the last 168 hours Cardiac Enzymes:  No results found for this basename: CKTOTAL, CKMB, CKMBINDEX, TROPONINI,  in the last 168 hours Urinalysis: No results found for this basename: COLORURINE, APPERANCEUR, LABSPEC, PHURINE, GLUCOSEU, HGBUR, BILIRUBINUR, KETONESUR, PROTEINUR, UROBILINOGEN, NITRITE, LEUKOCYTESUR,  in the last 168 hours Lipid Panel    Component Value Date/Time   CHOL 136  09/26/2013 0330   TRIG 102 09/26/2013 0330   HDL 26* 09/26/2013 0330   CHOLHDL 5.2 09/26/2013 0330   VLDL 20 09/26/2013 0330   LDLCALC 90 09/26/2013 0330   HgbA1C  Lab Results  Component Value Date   HGBA1C 6.5* 09/26/2013    Urine Drug Screen:   No results found for this basename: labopia,  cocainscrnur,  labbenz,  amphetmu,  thcu,  labbarb    Alcohol Level: No results found for this basename: ETH,  in the last 168 hours  Occult blood in stool - negative.  Ct Head Wo Contrast 10/02/13  Typical evolution of left posterior MCA infarct without hemorrhage. No new findings.  09/26/2013    No acute intracranial abnormality. Atrophy, chronic small vessel disease.    09/25/2013    1. No acute intracranial pathology seen on  CT. 2. Mild to moderate cortical volume loss and diffuse small vessel ischemic microangiopathy.    MRI/MRA of the brain  09/27/2013 Acute infarct in the posterior portion of the left MCA territory. Small area of acute infarct in the right medial occipital lobe. Severe stenosis distal left M1 segment with occlusion of the posterior segment of the left middle cerebral artery corresponding to acute infarct. Decreased flow in the anterior division of the left middle cerebral artery.  Carotid Doppler  Bilateral: 1-39% ICA stenosis. Vertebral artery flow is antegrade.  [place] left LE 10/01/2013 Large subcutaneous collection compatible with hematoma, tracking from the left groin up the left abdominal wall. No active hemorrhage identified on Doppler interrogation  2D Echocardiogram  - LVEF 70-75%, severe concentric LVH, trileaflet calcified aortic valve with calcified mass on non-coronary cusp and calcification of the tip of the left coronary cusp, no significant stenosis, trivial AI, moderate TR, RVSP 53 (moderate pulmonary hypertension), diastolic dysfunction with indeterminate, but likely elevated LV Filling pressure.  EKG  Sinus or ectopic atrial rhythm rate 55 beats per minute.  For complete results please see formal report.   Dg Chest Port 1 View 10/03/2013 Left lower lobe consolidation. Stable cardiomegaly. No pneumothorax. 09/29/2013   Increasing opacity at the right lung base consistent with atelectasis versus developing pneumonia.  09/27/2013   Tube and catheter positions as described without pneumothorax. Left base atelectasis. Elsewhere lungs clear.    09/26/2013   1. Endotracheal tube seen ending 3-4 cm above the carina. 2. Left subclavian line seen ending about the distal SVC. 3. Enteric tube noted extending below the diaphragm, though the side port is seen ending about the distal esophagus. 4. No acute cardiopulmonary process seen; borderline cardiomegaly noted.     ABD portable 10/02/2013 Feeding  tube tip in stomach. Suspect a degree of colonic ileus. The soft tissue mass in the left pelvic region noted on CT is also apparent on radiographic examination.  09/30/2013 Feeding tube is in place with the tip in the stomach. 09/30/2013 1. Feeding tube looped in the distal esophagus.  CT abdomen & pelvix 10/02/2013  Mild bilateral pleural effusions are noted with adjacent subsegmental atelectasis. Mild to moderate anasarca is noted in the subcutaneous tissues of the abdomen and pelvis. Large hematoma seen in the left anterior abdominal wall. Also noted is 4.3 cm hyperdensity in the pelvis anterior to the uterus concerning for blood clot. Alternatively, it potentially may represent mass. Further evaluation with ultrasound or MRI is recommended.   Left Hip 1 View Xray 10/09/2013 Very limited views. I see no evidence of femoral neck fracture.  Consider dedicated pelvic view to better assess.  Therapy Recommendations  SNF     Physical exam General - thin, well developed, african [place] lady not in distress.   Ophthalmologic - not cooperative on exam.  Cardiovascular - Regular rate and rhythm with no murmur.  Respiratory - bilateral coarse rhonchi and decreased breath sounds.  Extremities - right LE BKA, Left LE wound at left shin and left groin hematoma much improved with left flank ecchymosis.   NEUROLOGIC:  lethargic, open eyes on voice, globally aphasic and mumbles words but unintelligible, did not follow any commands. PERRL, sluggish to light reaction, left gaze preference and right side neglect,  R field cut; positive corneal and gag, left UE spontaneously movement, but right UE and RLU 0/5 on pain stimulation, b/l LEs withdraw to pain and against gravity. Left babinski negative, right BKA. .  ASSESSMENT Victoria West is a 80 y.o. female presenting with aphasia and right hemiparesis . TPA was not administered secondary to late presentation. Status post left common carotid  arteriogram, followed by mechanical thrombolysis of occluded inf division of LT MCA using a microguidewire - Dr Corliss Skains 09/26/2013 1:11 AM. Head CT revealed no acute findings. MRI confirmed left MCA stroke and M2 cut off.  On aspirin 81 mg orally every day prior to admission. Now on no antithrombotics or anticoagulation for secondary stroke prevention secondary to left groin hematoma and subsequent anemia. Patient with resultant right hemiplegia, dysphagia.   Cardiac arrest, resolved  Hyperlipidemia  - Cholesterol 136; LDL 90 - Pravachol prior to admission, resume after po assess  Hypertension history  Dementia  Respiratory Failure, Intubated for intervention, unable to protect airway following, now extubated  Diabetes mellitus - hemoglobin A1c 6.5  Renal insufficiency  Low calcium level, resolved  Right BKA  HTN - initially hypotension on dopamine, later cardene drip for HTN - cardene drip restarted for BP > 170, only on for a couple of hours prior to being turned off - on lisinopril, amlodipine, coreg, hydralazine - BP evelated, receiving hydralazine for goal < 170 per RN. Goal changed previously to < 200. Will alert RN and hope to clarify order - po meds were on hold until PEG use confirmed - have been resumed   Anemia/black stool - due to groin hematoma, requiring blood transfusion. - Hb stable at 11.2 - transfused during hospitalization - stool guaiac neg x 2 - initially placed on protonix drip, changed to protonix  bid   Left Groin hematoma - Hematoma and left femoral artery puncture site, requiring transfusion - hematoma resided  - no active bleeding - [place] and CT showed large hematoma tracking to abdominal wall - close monitor H&H  Pressure Wounds L tibial, L heel, sacrum (stage 2) Wound RN recommendations   PLAN Stroke - MRI confirmed left MCA stroke with M2 cut off.  - repeat CT  showed evolving left MCA stroke, no midline shift. - Discontinued ASA due  to low Hb requiring transfusion. May consider to resume once Hb stable. Check in AM. - Discontinued heparin subq for DVT prophylaxis dropping Hb requiring transfusion. May consider to resume once hb stable. H/H stable 10.9 / 33 today - appears stable - SCDs not ordered secondary to right BKA and no palpable pulse left lower extremity. - Anemia - Hb stable at 11 after 2 PRBC transfusion. Continue to monitor  - Hypokalemia - resolved, this am 4.4. Will give oral supplement - palliative care on board - DO NOT RESUSCITATE - G tube placed by Trauma 10/06/2013. Removed by patient during  the night. Attempted replacement same site without success. New PEG placed in an adjacent site. Per trauma surgeon - tube feedings to be resumed. - Palliative care following for support  Left hip xray.- Very limited views. I see no evidence of femoral neck fracture. Consider dedicated pelvic view to better assess.    Delton See PA-C [facility] Pager [phone] 10/10/2013, 8:04 AM  I have personally examined this patient, reviewed notes, independently viewed imaging studies, participated in medical decision making and plan of care. I have made any additions or clarifications directly to the above note. Agree with note above.       To contact Stroke Continuity provider, please refer to [email or web address]. After hours, contact General Neurology

## 2013-10-10 NOTE — Progress Notes (Signed)
Patient ID: Victoria West, female   DOB: 08/19/1924, 80 y.o.   MRN: [id number]   LOS: 15 days   Subjective: Mumbles   Objective: Vital signs in last 24 hours: Temp:  [97.7 F (36.5 C)-98.6 F (37 C)] 98.1 F (36.7 C) (08/30 0953) Pulse Rate:  [52-64] 52 (08/30 0953) Resp:  [16-17] 16 (08/30 0953) BP: (159-180)/(52-78) 159/52 mmHg (08/30 0953) SpO2:  [98 %-100 %] 99 % (08/30 0953) Weight:  [134 lb 9.6 oz (61.054 kg)] 134 lb 9.6 oz (61.054 kg) (08/30 0510) Last BM Date: 10/05/13   Laboratory  CBC  Recent Labs  10/09/13 0619 10/10/13 0354  WBC 9.1 8.4  HGB 10.8* 10.9*  HCT 33.1* 33.0*  PLT 354 298   BMET  Recent Labs  10/09/13 0619 10/10/13 0354  NA 147 143  K 3.2* 3.1*  CL 113* 112  CO2 21 21  GLUCOSE 87 195*  BUN 19 27*  CREATININE 0.61 0.69  CALCIUM 8.6 8.1*    Physical Exam GI: Soft, +BS, PEG site WNL   Assessment/Plan: Dysphagia s/p PEG tube placement/replacement -- Tolerating TF. No s/sx of peritonitis. Will sign off. Please call with questions.    Freeman Caldron, PA-C Pager: [phone] 10/10/2013

## 2013-10-10 NOTE — Progress Notes (Signed)
Unable to complete patient education (non had been done).  Patient unable to process information.  Lance Bosch, RN

## 2013-10-11 DIAGNOSIS — S301XXA Contusion of abdominal wall, initial encounter: Secondary | ICD-10-CM | POA: Diagnosis not present

## 2013-10-11 DIAGNOSIS — E876 Hypokalemia: Secondary | ICD-10-CM | POA: Diagnosis present

## 2013-10-11 DIAGNOSIS — S3012XA Contusion of groin, initial encounter: Secondary | ICD-10-CM | POA: Diagnosis not present

## 2013-10-11 DIAGNOSIS — F0390 Unspecified dementia without behavioral disturbance: Secondary | ICD-10-CM | POA: Diagnosis present

## 2013-10-11 DIAGNOSIS — I469 Cardiac arrest, cause unspecified: Secondary | ICD-10-CM | POA: Diagnosis not present

## 2013-10-11 DIAGNOSIS — E785 Hyperlipidemia, unspecified: Secondary | ICD-10-CM | POA: Diagnosis present

## 2013-10-11 DIAGNOSIS — E119 Type 2 diabetes mellitus without complications: Secondary | ICD-10-CM | POA: Diagnosis present

## 2013-10-11 DIAGNOSIS — I69391 Dysphagia following cerebral infarction: Secondary | ICD-10-CM

## 2013-10-11 LAB — BASIC METABOLIC PANEL
Anion gap: 9 (ref 5–15)
BUN: 30 mg/dL — ABNORMAL HIGH (ref 6–23)
CALCIUM: 7.9 mg/dL — AB (ref 8.4–10.5)
CO2: 21 mEq/L (ref 19–32)
Chloride: 116 mEq/L — ABNORMAL HIGH (ref 96–112)
Creatinine, Ser: 0.64 mg/dL (ref 0.50–1.10)
GFR calc Af Amer: >90 mL/min (ref >90)
GFR, EST NON AFRICAN AMERICAN: 78 mL/min — AB (ref >90)
GLUCOSE: 154 mg/dL — AB (ref 70–99)
Potassium: 3.4 mEq/L — ABNORMAL LOW (ref 3.7–5.3)
SODIUM: 146 meq/L (ref 137–147)

## 2013-10-11 LAB — GLUCOSE, CAPILLARY
GLUCOSE-CAPILLARY: 135 mg/dL — AB (ref 70–99)
GLUCOSE-CAPILLARY: 160 mg/dL — AB (ref 70–99)
GLUCOSE-CAPILLARY: 128 mg/dL — AB (ref 70–99)
Glucose-Capillary: 135 mg/dL — ABNORMAL HIGH (ref 70–99)
Glucose-Capillary: 79 mg/dL (ref 70–99)

## 2013-10-11 LAB — CBC
HCT: 32.6 % — ABNORMAL LOW (ref 36.0–46.0)
HEMOGLOBIN: 11 g/dL — AB (ref 12.0–15.0)
MCH: 31.8 pg (ref 26.0–34.0)
MCHC: 33.7 g/dL (ref 30.0–36.0)
MCV: 94.2 fL (ref 78.0–100.0)
Platelets: 308 10*3/uL (ref 150–400)
RBC: 3.46 MIL/uL — ABNORMAL LOW (ref 3.87–5.11)
RDW: 16.9 % — ABNORMAL HIGH (ref 11.5–15.5)
WBC: 11.4 10*3/uL — ABNORMAL HIGH (ref 4.0–10.5)

## 2013-10-11 MED ORDER — CLONIDINE HCL 0.1 MG PO TABS: 0.1000 mg | ORAL_TABLET | Freq: Three times a day (TID) | ORAL | Status: DC

## 2013-10-11 MED ORDER — CARVEDILOL 6.25 MG PO TABS: 6.2500 mg | ORAL_TABLET | Freq: Two times a day (BID) | ORAL | Status: DC

## 2013-10-11 MED ORDER — CLONIDINE HCL 0.1 MG PO TABS
0.1000 mg | ORAL_TABLET | Freq: Three times a day (TID) | ORAL | Status: DC
  Administered 2013-10-11: 0.1 mg via ORAL
  Filled 2013-10-11: qty 1

## 2013-10-11 MED ORDER — POLYSACCHARIDE IRON COMPLEX 150 MG PO CAPS: 150.0000 mg | ORAL_CAPSULE | Freq: Every day | ORAL | Status: AC

## 2013-10-11 MED ORDER — METHAZOLAMIDE 25 MG PO TABS: 25.0000 mg | ORAL_TABLET | Freq: Two times a day (BID) | ORAL | Status: DC

## 2013-10-11 MED ORDER — METHIMAZOLE 5 MG PO TABS: 5.0000 mg | ORAL_TABLET | Freq: Every day | ORAL | Status: DC

## 2013-10-11 MED ORDER — HEPARIN SODIUM (PORCINE) 5000 UNIT/ML IJ SOLN
5000.0000 [IU] | Freq: Two times a day (BID) | INTRAMUSCULAR | Status: DC
  Administered 2013-10-11: 5000 [IU] via SUBCUTANEOUS
  Filled 2013-10-11: qty 1

## 2013-10-11 MED ORDER — PANTOPRAZOLE SODIUM 40 MG PO PACK: 40.0000 mg | PACK | Freq: Every day | ORAL | Status: DC

## 2013-10-11 MED ORDER — ASPIRIN 81 MG PO CHEW
81.0000 mg | CHEWABLE_TABLET | Freq: Every day | ORAL | Status: DC
  Administered 2013-10-11: 81 mg
  Filled 2013-10-11: qty 1

## 2013-10-11 MED ORDER — AMLODIPINE BESYLATE 10 MG PO TABS: 10.0000 mg | ORAL_TABLET | Freq: Every day | ORAL | Status: AC

## 2013-10-11 MED ORDER — ASPIRIN 81 MG PO CHEW: 81.0000 mg | CHEWABLE_TABLET | Freq: Every day | ORAL | Status: AC

## 2013-10-11 MED ORDER — BACITRACIN ZINC 500 UNIT/GM EX OINT: TOPICAL_OINTMENT | Freq: Every day | CUTANEOUS | Status: DC

## 2013-10-11 MED ORDER — POTASSIUM CHLORIDE CRYS ER 20 MEQ PO TBCR
40.0000 meq | EXTENDED_RELEASE_TABLET | ORAL | Status: AC
  Administered 2013-10-11 (×2): 40 meq via ORAL
  Filled 2013-10-11 (×2): qty 2

## 2013-10-11 MED ORDER — OSMOLITE 1.2 CAL PO LIQD: 1000.0000 mL | ORAL | Status: DC

## 2013-10-11 MED ORDER — PRO-STAT SUGAR FREE PO LIQD: 30.0000 mL | Freq: Two times a day (BID) | ORAL | Status: DC

## 2013-10-11 NOTE — Progress Notes (Signed)
PTAR at bedside to transport pt to [facility]. Son contacted, made aware of pt's transfer at this time. Pt's central line d/c'd by IV team. PEG tube unhooked from tube feedings. Pt calm, no acute distress noted. Report called earlier to RN at [facility]; no changes in pt's condition since report called. Night shift, day shift, charge RN all aware.

## 2013-10-11 NOTE — Progress Notes (Signed)
Physical Therapy Treatment Patient Details Name: Victoria West MRN: [id number] DOB: [birth year] Today's Date: 10/11/2013    History of Present Illness 80 yo resident of [facility] with R BKA who suffered L MCA CVA    PT Comments    Very little improvement.  Emphasized sitting balance, moderating push, trying to get pt to focus and track as well as attend.  Pt with gaze locked in extreme left field with no attention to the object to track.  Signs of neglect on R.  Follow Up Recommendations  SNF;Supervision/Assistance - 24 hour     Equipment Recommendations  None recommended by PT    Recommendations for Other Services       Precautions / Restrictions       Mobility  Bed Mobility Overal bed mobility: Needs Assistance;+2 for physical assistance Bed Mobility: Supine to Sit;Sit to Supine     Supine to sit: Total assist Sit to supine: Total assist;+2 for safety/equipment   General bed mobility comments: pt moving L LE voluntarily, but not purposefully to assist with getting OOB.  Again had to use pad to pivot around to EOB.   Significant truncal assist at EOB.  Transfers Overall transfer level: Needs assistance Equipment used: 2 person hand held assist Transfers: Sit to/from Stand Sit to Stand: Total assist;+2 physical assistance         General transfer comment: sit to stand aborted due to pt starting to sink toward the floor.  Ambulation/Gait             General Gait Details: not applicable   Stairs            Wheelchair Mobility    Modified Rankin (Stroke Patients Only) Modified Rankin (Stroke Patients Only) Pre-Morbid Rankin Score: Moderately severe disability Modified Rankin: Severe disability     Balance Overall balance assessment: Needs assistance Sitting-balance support: Single extremity supported Sitting balance-Leahy Scale: Poor Sitting balance - Comments: demonstrating some pushing behaviors  toward the R                             Cognition Arousal/Alertness: Lethargic Behavior During Therapy: Flat affect Overall Cognitive Status: Difficult to assess         Following Commands:  (not following commands even with non verbal cues)            Exercises Other Exercises Other Exercises: P/AAROM to all 4's    General Comments        Pertinent Vitals/Pain Pain Assessment: Faces Faces Pain Scale: Hurts little more    Home Living                      Prior Function            PT Goals (current goals can now be found in the care plan section) Acute Rehab PT Goals PT Goal Formulation: With patient/family Time For Goal Achievement: 10/19/13 Potential to Achieve Goals: Fair Progress towards PT goals: Not progressing toward goals - comment (significant impairments limiting progression)    Frequency  Min 2X/week    PT Plan Current plan remains appropriate    Co-evaluation             End of Session Equipment Utilized During Treatment: Gait belt Activity Tolerance: Patient limited by fatigue Patient left: with call bell/phone within reach;in bed     Time: 4098-1191 PT Time Calculation (min): 29 min  Charges:  $Therapeutic  Activity: 23-37 mins                    G Codes:      Ivah Girardot, Eliseo Gum 10/11/2013, 1:36 PM 10/11/2013  [place], PT [phone]  (pager)

## 2013-10-11 NOTE — Discharge Summary (Signed)
Stroke Discharge Summary  Patient ID: Victoria West   MRN: [id number]      DOB: [birth year]  Date of Admission: 09/25/2013 Date of Discharge: 10/11/2013  Attending Physician:  Marvel Plan, MD, Stroke MD  Consulting Physician(s):   Derenda Mis, MD (Palliative [facility]), Rory Percy, MD (pulmonary/intensive care), Jimmye Norman, MD (general surgery for PEG) and Bebe Shaggy, RN (wound ostomy continence)  Patient's PCP:  Darlina Guys, MD  DISCHARGE DIAGNOSIS:    Cerebral embolism with cerebral infarction -  L MCA, embolic s/p mechanical thrombectomy   Hypertension with Hypertensive urgency   Acute respiratory failure, unspecified whether with hypoxia or hypercapnia   Acute blood loss anemia   Left Groin hematoma s/p neuro intervention for acute stroke   Cardiac arrest   Other and unspecified hyperlipidemia   Diabetes type 2, controlled   Dysphagia following cerebrovascular accident   Dementia   Hypokalemia   Pressure Wounds L tibial, L heel, sacrum (stage 2)     Hypocalcemia resolved    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   Hypokalemia  Past Medical History  Diagnosis Date  . Stroke 2015  . Diabetes mellitus without complication   . Hypertension   . Anemia    Past Surgical History  Procedure Laterality Date  . Radiology with anesthesia N/A 09/25/2013    Procedure: RADIOLOGY WITH ANESTHESIA;  Surgeon: Oneal Grout, MD;  Location: [facility] OR;  Service: Radiology;  Laterality: N/A;  . Esophagogastroduodenoscopy N/A 10/06/2013    Procedure: ESOPHAGOGASTRODUODENOSCOPY (EGD);  Surgeon: Cherylynn Ridges, MD;  Location: [facility] ENDOSCOPY;  Service: General;  Laterality: N/A;  . Peg placement N/A 10/06/2013    Procedure: PERCUTANEOUS ENDOSCOPIC GASTROSTOMY (PEG) PLACEMENT;  Surgeon: Cherylynn Ridges, MD;  Location: [facility] ENDOSCOPY;  Service: General;  Laterality: N/A;  . Esophagogastroduodenoscopy N/A 10/07/2013    Procedure: ESOPHAGOGASTRODUODENOSCOPY (EGD);  Surgeon: Cherylynn Ridges, MD;  Location: [facility] ENDOSCOPY;  Service: General;  Laterality: N/A;  . Peg placement N/A 10/07/2013    Procedure: PERCUTANEOUS ENDOSCOPIC GASTROSTOMY (PEG) PLACEMENT;  Surgeon: Cherylynn Ridges, MD;  Location: [facility] ENDOSCOPY;  Service: General;  Laterality: N/A;      Medication List    STOP taking these medications       aspirin EC 81 MG tablet  Replaced by:  aspirin 81 MG chewable tablet     estradiol 0.1 MG/GM vaginal cream  Commonly known as:  ESTRACE     HYDROcodone-acetaminophen 5-325 MG per tablet  Commonly known as:  NORCO/VICODIN     omeprazole 20 MG capsule  Commonly known as:  PRILOSEC     polyethylene glycol packet  Commonly known as:  MIRALAX / GLYCOLAX      TAKE these medications       amLODipine 10 MG tablet  Commonly known as:  NORVASC  Place 1 tablet (10 mg total) into feeding tube daily.     aspirin 81 MG chewable tablet  Place 1 tablet (81 mg total) into feeding tube daily.     bacitracin ointment  Apply topically daily.     brinzolamide 1 % ophthalmic suspension  Commonly known as:  AZOPT  Place 1 drop into both eyes 2 (two) times daily.     carvedilol 6.25 MG tablet  Commonly known as:  COREG  Place 1 tablet (6.25 mg total) into feeding tube 2 (two) times daily with a meal.     cloNIDine 0.1 MG tablet  Commonly known as:  CATAPRES  Take 1 tablet (0.1 mg total) by mouth 3 (three) times daily.     COMBIGAN 0.2-0.5 % ophthalmic solution  Generic drug:  brimonidine-timolol  Place 1 drop into both eyes every 12 (twelve) hours.     dorzolamide-timolol 22.3-6.8 MG/ML ophthalmic solution  Commonly known as:  COSOPT  Place 1 drop into both eyes 2 (two) times daily.     eucerin cream  Apply 1 application topically as needed for dry skin.     feeding supplement (OSMOLITE 1.2 CAL) Liqd  Place 1,000 mLs into feeding tube continuous. Start at 28ml/hr increase by 10ml every 4 hours to goal of 47ml/hr     feeding supplement (PRO-STAT SUGAR FREE 64)  Liqd  Place 30 mLs into feeding tube 2 times daily at 12 noon and 4 pm.     hydrALAZINE 100 MG tablet  Commonly known as:  APRESOLINE  Take 100 mg by mouth 3 (three) times daily.     iron polysaccharides 150 MG capsule  Commonly known as:  NIFEREX  Place 1 capsule (150 mg total) into feeding tube daily.     isosorbide mononitrate 60 MG 24 hr tablet  Commonly known as:  IMDUR  Take 60 mg by mouth daily.     lisinopril 20 MG tablet  Commonly known as:  PRINIVIL,ZESTRIL  Take 20 mg by mouth daily.     memantine 10 MG tablet  Commonly known as:  NAMENDA  Take 10 mg by mouth 2 (two) times daily.     methazolamide 25 MG tablet  Commonly known as:  NEPTAZANE  Place 1 tablet (25 mg total) into feeding tube 2 (two) times daily.     methimazole 5 MG tablet  Commonly known as:  TAPAZOLE  Place 1 tablet (5 mg total) into feeding tube daily. Take ONCE daily on Mon, Wed, & Fri. Take TWICE daily on Tues, Thurs, Sat, & SunTake 5 mg by mouth daily. Take ONCE daily on Mon, Wed, & Fri. Take TWICE daily on Tues, Thurs, Sat, & Sun     pantoprazole sodium 40 mg/20 mL Pack  Commonly known as:  PROTONIX  Place 20 mLs (40 mg total) into feeding tube daily.     pilocarpine 2 % ophthalmic solution  Commonly known as:  PILOCAR  Place 1 drop into both eyes 4 (four) times daily.     pravastatin 10 MG tablet  Commonly known as:  PRAVACHOL  Take 10 mg by mouth at bedtime.     sertraline 25 MG tablet  Commonly known as:  ZOLOFT  Take 25 mg by mouth daily. Take with  to equal total      sertraline 100 MG tablet  Commonly known as:  ZOLOFT  Take 100 mg by mouth daily. Take with  to equal total      Travoprost (BAK Free) 0.004 % Soln ophthalmic solution  Commonly known as:  TRAVATAN  Place 1 drop into the right eye at bedtime.        LABORATORY STUDIES CBC    Component Value Date/Time   WBC 11.4* 10/11/2013 0755   RBC 3.46* 10/11/2013 0755   HGB 11.0* 10/11/2013 0755    HCT 32.6* 10/11/2013 0755   PLT 308 10/11/2013 0755   MCV 94.2 10/11/2013 0755   MCH 31.8 10/11/2013 0755   MCHC 33.7 10/11/2013 0755   RDW 16.9* 10/11/2013 0755   LYMPHSABS 1.0 09/27/2013 0459   MONOABS 0.6 09/27/2013 0459   EOSABS 0.0 09/27/2013 0459  BASOSABS 0.0 09/27/2013 0459   CMP    Component Value Date/Time   NA 146 10/11/2013 0755   K 3.4* 10/11/2013 0755   CL 116* 10/11/2013 0755   CO2 21 10/11/2013 0755   GLUCOSE 154* 10/11/2013 0755   BUN 30* 10/11/2013 0755   CREATININE 0.64 10/11/2013 0755   CALCIUM 7.9* 10/11/2013 0755   PROT 4.6* 09/27/2013 0459   ALBUMIN 2.4* 09/27/2013 0459   AST 11 09/27/2013 0459   ALT <5 09/27/2013 0459   ALKPHOS 53 09/27/2013 0459   BILITOT 0.3 09/27/2013 0459   GFRNONAA 78* 10/11/2013 0755   GFRAA >90 10/11/2013 0755   COAGS Lab Results  Component Value Date   INR 1.27 09/27/2013   INR 1.54* 09/26/2013   INR 1.39 09/26/2013   Lipid Panel    Component Value Date/Time   CHOL 136 09/26/2013 0330   TRIG 102 09/26/2013 0330   HDL 26* 09/26/2013 0330   CHOLHDL 5.2 09/26/2013 0330   VLDL 20 09/26/2013 0330   LDLCALC 90 09/26/2013 0330   HgbA1C  Lab Results  Component Value Date   HGBA1C 6.5* 09/26/2013   Cardiac Panel (last 3 results) No results found for this basename: CKTOTAL, CKMB, TROPONINI, RELINDX,  in the last 72 hours Urinalysis No results found for this basename: colorurine,  appearanceur,  labspec,  phurine,  glucoseu,  hgbur,  bilirubinur,  ketonesur,  proteinur,  urobilinogen,  nitrite,  leukocytesur   Urine Drug Screen  No results found for this basename: labopia,  cocainscrnur,  labbenz,  amphetmu,  thcu,  labbarb    Alcohol Level No results found for this basename: eth     SIGNIFICANT DIAGNOSTIC STUDIES Ct Head Wo Contrast  10/02/13 Typical evolution of left posterior MCA infarct without hemorrhage. No new findings.  09/26/2013 No acute intracranial abnormality. Atrophy, chronic small vessel disease.  09/25/2013 1. No acute intracranial  pathology seen on CT. 2. Mild to moderate cortical volume loss and diffuse small vessel ischemic microangiopathy.  MRI/MRA of the brain 09/27/2013 Acute infarct in the posterior portion of the left MCA territory. Small area of acute infarct in the right medial occipital lobe. Severe stenosis distal left M1 segment with occlusion of the posterior segment of the left middle cerebral artery corresponding to acute infarct. Decreased flow in the anterior division of the left middle cerebral artery.  Carotid Doppler Bilateral: 1-39% ICA stenosis. Vertebral artery flow is antegrade.  [place] left LE 10/01/2013 Large subcutaneous collection compatible with hematoma, tracking from the left groin up the left abdominal wall. No active hemorrhage identified on Doppler interrogation  2D Echocardiogram - LVEF 70-75%, severe concentric LVH, trileaflet calcified aortic valve with calcified mass on non-coronary cusp and calcification of the tip of the left coronary cusp, no significant stenosis, trivial AI, moderate TR, RVSP 53 (moderate pulmonary hypertension), diastolic dysfunction with indeterminate, but likely elevated LV Filling pressure.  EKG Sinus or ectopic atrial rhythm rate 55 beats per minute. For complete results please see formal report.  Dg Chest Port 1 View  10/03/2013 Left lower lobe consolidation. Stable cardiomegaly. No pneumothorax.  09/29/2013 Increasing opacity at the right lung base consistent with atelectasis versus developing pneumonia.  09/27/2013 Tube and catheter positions as described without pneumothorax. Left base atelectasis. Elsewhere lungs clear.  09/26/2013 1. Endotracheal tube seen ending 3-4 cm above the carina. 2. Left subclavian line seen ending about the distal SVC. 3. Enteric tube noted extending below the diaphragm, though the side port is seen ending about the  distal esophagus. 4. No acute cardiopulmonary process seen; borderline cardiomegaly noted.  ABD xray 10/09/2013 1. Gastrostomy tube  in stomach noted. Gastrostomy tube and stomach contain contrast. No gastricdistention . 2. Adynamic ileus. 10/02/2013 Feeding tube tip in stomach. Suspect a degree of colonic ileus. The soft tissue mass in the left pelvic region noted on CT is also apparent on radiographic examination.  09/30/2013 Feeding tube is in place with the tip in the stomach.  09/30/2013 1. Feeding tube looped in the distal esophagus.  CT abdomen & pelvis 10/02/2013 Mild bilateral pleural effusions are noted with adjacent subsegmental atelectasis. Mild to moderate anasarca is noted in the subcutaneous tissues of the abdomen and pelvis. Large hematoma seen in the left anterior abdominal wall. Also noted is 4.3 cm hyperdensity in the pelvis anterior to the uterus concerning for blood clot. Alternatively, it potentially may represent mass. Further evaluation with ultrasound or MRI is recommended.  Left Hip 1 View Xray 10/09/2013 Very limited views. I see no evidence of femoral neck fracture. Consider dedicated pelvic view to better assess.      HISTORY OF PRESENT ILLNES Victoria West is an 80 y.o. female with a history of hypertension, hypothyroidism, glaucoma and dementia who was brought to the emergency room 09/25/2013 after being found unable to speak as well as unable to move her right side and gaze to the left side. There is no previous history of stroke. CT scan of her head showed no acute intracranial abnormality. She was last seen well at 1700 today. She was beyond time window for intervention with TPA when she arrived in the emergency room. NIH stroke score was 24. S/P Left common carotid arteriogram,followed by mechanical thrombolysis of occluded inf division of LT MCA using a microguidewire - Dr Corliss Skains 09/26/2013 1:11 AM   HOSPITAL COURSE  Stroke:  L MCA, embolic s/p mechanical thrombectomy  Respiratory Failure, Intubated for intervention, unable to protect airway following, now extubated   On aspirin 81 mg orally every  day prior to admission. Initially not placed on antithrombotics for secondary stroke prevention secondary to left groin hematoma and subsequent anemia. Anemia now stable. Aspirin 81 mg resumed day of discharge  Patient with resultant right hemiplegia, dysphagia.  Needs skilled nursing facility for ongoing PT, OT and ST  Palliative care consulted. Family agreed to PEG, return to SNF and DO NOT RESUSCITATE   Left Groin hematoma   Consequence of mechanical thrombectomy at left femoral artery puncture site  [place] and CT showed large hematoma tracking to abdominal wall   required transfusion   hematoma resided   H&H stable after transfusion x 2  Acute blood loss anemia/black stool  Hb stable at 11.2   transfused 2 units during hospitalization   stool guaiac neg x 2   initially placed on protonix drip, changed to protonix  bid   Cardiac arrest, resolved   Post mechanical thrombectomy with acute blood loss  Hypertension  initially hypotension on dopamine, later cardene drip for HTN   Goal progressively increased post neuro intervention  Clonidine 0.1 mg tid added day of discharge  continued on lisinopril, amlodipine, coreg, hydralazine  Hyperlipidemia  Cholesterol 136; LDL 90  Pravachol prior to admission, resumed  Diabetes mellitus   hemoglobin A1c 6.5, at goal < 7.0  Dysphagia secondary to stroke  G tube placed by Trauma 10/06/2013. Removed by patient during the night 10/07/2013. Attempted replacement same site without success. New PEG placed in an adjacent site.   Per trauma surgeon -  ok for tube feedings to be resumed. Patient tolerating well.   Ok to change to bolus feeds at [facility].  Pressure Wounds   L tibial, L heel, sacrum (stage 2)   Wound RN consult  Instituted recommendations  left tibial: Aquacel applied into pocket and foam dressing to remainder of wound to provide antimicrobial benefits and absorb drainage.   Left heel: best practice to leave  wound dry and open to are with heel floated.  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   Hypokalemia, replaced throughout hospitalization   DISCHARGE EXAM Blood pressure 183/52, pulse 57, temperature 97.5 F (36.4 C), temperature source Axillary, resp. rate 18, height  (1.626 m), weight 60.011 kg (132 lb 4.8 oz), SpO2 100.00%.  General - thin, well developed, african [place] lady not in distress.  Ophthalmologic - not cooperative on exam.  Cardiovascular - Regular rate and rhythm with no murmur.  Respiratory - bilateral coarse rhonchi and decreased breath sounds.  Extremities - right LE BKA, Left LE wound at left shin and left groin hematoma much improved with left flank ecchymosis.  NEUROLOGIC:  lethargic, open eyes on voice, globally aphasic and mumbles words but unintelligible, did not follow any commands. PERRL, sluggish to light reaction, left gaze preference and right side neglect, R field cut; positive corneal and gag, left UE spontaneously movement, but right UE and RLU 0/5 on pain stimulation, b/l LEs withdraw to pain and against gravity. Left babinski negative, right BKA. Marland Kitchen  Discharge Diet   NPO , tube feedings  DISCHARGE PLAN  Disposition:  skilled nursing facility as prior to admission   aspirin 81 mg orally every day for secondary stroke prevention.  Ongoing risk factor control by Primary Care Physician. Risk factor recommendations:  Hypertension target range 130-140/70-80, Lipid range - LDL < 100 and checked every 6 months, fasting, Diabetes - HgB A1C <7  Palliative support for patient/family Change TF to bolus  Follow-up POWELL, JERRY, MD in 2 weeks.  Follow-up with Dr. Roda Shutters, Stroke Clinic in 2 months.  45 minutes were spent preparing discharge.  Annie Main, MSN, RN, ANVP-BC, ANP-BC, GNP-BC [facility] Pager: [phone] 10/11/2013 3:15  PM  Signed I, the attending vascular neurologist, have personally obtained a history, examined the patient, evaluated laboratory data, individually viewed imaging studies, and formulated the assessment and plan of care.  I have made any additions or clarifications directly to the above note and agree with the findings and plan as currently documented.   Marvel Plan, MD PhD Stroke Neurology 10/11/2013 11:39 PM

## 2013-10-11 NOTE — Clinical Social Work Note (Signed)
Discharge summary has been faxed to [facility]. Discharge packet complete and placed on pt's shadow chart. Pt's son, Allyson Tineo, updated via phone regarding discharge disposition. EMS ([facility]) arranged to provide transport.   RN to call [phone] for report.  Marcelline Deist, MSW, [facility] Licensed Clinical Social Worker [phone] and [phone]

## 2013-10-11 NOTE — Progress Notes (Signed)
Report given to RN at [facility]. If any changes occur before [facility] arrives to transport pt, the night shift RN can call back to update the report. All questions answered. Family aware that [facility] will come to transport the pt tonight and are OK with her going there tonight.

## 2013-10-11 NOTE — Progress Notes (Signed)
Patient left via stretcher with [facility]. Patient calm and cooperative. Family called about discharge.

## 2013-10-12 LAB — GLUCOSE, CAPILLARY: Glucose-Capillary: 118 mg/dL — ABNORMAL HIGH (ref 70–99)

## 2013-10-15 NOTE — Progress Notes (Signed)
I have seen and examined the pt and agree with PA-Jeffery's progress note.  

## 2013-10-23 ENCOUNTER — Encounter (HOSPITAL_COMMUNITY): Payer: Self-pay | Admitting: Emergency Medicine

## 2013-10-23 ENCOUNTER — Emergency Department (HOSPITAL_COMMUNITY): Payer: Medicare Other

## 2013-10-23 ENCOUNTER — Emergency Department (HOSPITAL_COMMUNITY)
Admission: EM | Admit: 2013-10-23 | Discharge: 2013-10-23 | Disposition: A | Discharge status: Home / Self Care | Payer: Medicare Other | Attending: Emergency Medicine | Admitting: Emergency Medicine

## 2013-10-23 DIAGNOSIS — Z79899 Other long term (current) drug therapy: Secondary | ICD-10-CM | POA: Diagnosis not present

## 2013-10-23 DIAGNOSIS — Z8673 Personal history of transient ischemic attack (TIA), and cerebral infarction without residual deficits: Secondary | ICD-10-CM | POA: Insufficient documentation

## 2013-10-23 DIAGNOSIS — Z7982 Long term (current) use of aspirin: Secondary | ICD-10-CM | POA: Diagnosis not present

## 2013-10-23 DIAGNOSIS — E119 Type 2 diabetes mellitus without complications: Secondary | ICD-10-CM | POA: Insufficient documentation

## 2013-10-23 DIAGNOSIS — K9423 Gastrostomy malfunction: Secondary | ICD-10-CM | POA: Insufficient documentation

## 2013-10-23 DIAGNOSIS — D649 Anemia, unspecified: Secondary | ICD-10-CM | POA: Diagnosis not present

## 2013-10-23 DIAGNOSIS — Z792 Long term (current) use of antibiotics: Secondary | ICD-10-CM | POA: Diagnosis not present

## 2013-10-23 DIAGNOSIS — I158 Other secondary hypertension: Secondary | ICD-10-CM | POA: Diagnosis not present

## 2013-10-23 DIAGNOSIS — I159 Secondary hypertension, unspecified: Secondary | ICD-10-CM

## 2013-10-23 MED ORDER — IOHEXOL 300 MG/ML  SOLN
50.0000 mL | Freq: Once | INTRAMUSCULAR | Status: AC | PRN
  Administered 2013-10-23: 40 mL

## 2013-10-23 NOTE — ED Notes (Signed)
Report given to Angela, Charity fuMarylene Landaiser at [facility]

## 2013-10-23 NOTE — ED Notes (Signed)
Family states patient pulled out PEG tube this morning, family states she is confused. Upon arrival site intact, no skin breakdown noted. ED PA at bedside to replace tube.

## 2013-10-23 NOTE — ED Notes (Signed)
G tube dressing changed. Pt tolerated without difficulty. Pt to be transported back to facility. [facility] called for transport.

## 2013-10-23 NOTE — Discharge Instructions (Signed)
Continue tube care and feedings. Follow up with primary care doctor to recheck blood pressure. Elevated in ER today.    Care of a Feeding Tube People who have trouble swallowing or cannot take food or medicine by mouth are sometimes given feeding tubes. A feeding tube can go into the nose and down to the stomach or through the skin in the abdomen and into the stomach or small bowel. Some of the names of these feeding tubes are gastrostomy tubes, PEG lines, nasogastric tubes, and gastrojejunostomy tubes.  SUPPLIES NEEDED TO CARE FOR THE TUBE SITE  Clean gloves.  Clean wash cloth, gauze pads, or soft paper towel.  Cotton swabs.  Skin barrier ointment or cream.  Soap and water.  Pre-cut foam pads or gauze (that go around the tube).  Tube tape. TUBE SITE CARE 1. Have all supplies ready and available. 2. Wash hands well. 3. Put on clean gloves. 4. Remove the soiled foam pad or gauze, if present, that is found under the tube stabilizer. Change the foam pad or gauze daily or when soiled or moist. 5. Check the skin around the tube site for redness, rash, swelling, drainage, or extra tissue growth. If you notice any of these, call your caregiver. 6. Moisten gauze and cotton swabs with water and soap. 7. Wipe the area closest to the tube (right near the stoma) with cotton swabs. Wipe the surrounding skin with moistened gauze. Rinse with water. 8. Dry the skin and stoma site with a dry gauze pad or soft paper towel. Do not use antibiotic ointments at the tube site. 9. If the skin is red, apply a skin barrier cream or ointment (such as petroleum jelly) in a circular motion, using a cotton swab. The cream or ointment will provide a moisture barrier for the skin and helps with wound healing. 10. Apply a new pre-cut foam pad or gauze around the tube. Secure it with tape around the edges. If no drainage is present, foam pads or gauze may be left off. 11. Use tape or an anchoring device to fasten the  feeding tube to the skin for comfort or as directed. Rotate where you tape the tube to avoid skin damage from the adhesive. 12. Position the person in a semi-upright position (30-45 degree angle). 13. Throw away used supplies. 14. Remove gloves. 15. Wash hands. SUPPLIES NEEDED TO FLUSH A FEEDING TUBE  Clean gloves.  60 mL syringe (that connects to the feeding tube).  Towel.  Water. FLUSHING A FEEDING TUBE  1. Have all supplies ready and available. 2. Wash hands well. 3. Put on clean gloves. 4. Draw up 30 mL of water in the syringe. 5. Kink the feeding tube while disconnecting it from the feeding-bag tubing or while removing the plug at the end of the tube. Kinking closes the tube and prevents secretions in the tube from spilling out. 6. Insert the tip of the syringe into the end of the feeding tube. Release the kink. Slowly inject the water. 7. If unable to inject the water, the person with the feeding tube should lay on his or her left side. The tip of the tube may be against the stomach wall, blocking fluid flow. Changing positions may move the tip away from the stomach wall. After repositioning, try injecting the water again. 8. After injecting the water, remove the syringe. 9. Always flush before giving the first medicine, between medicines, and after the final medicine before starting a feeding. This prevents medicines from clogging  the tube. 10. Throw away used supplies. 11. Remove gloves. 12. Wash hands. Document Released: 01/28/2005 Document Revised: 01/15/2012 Document Reviewed: 09/12/2011 [facility] Patient Information 2015 [place], [place]. This information is not intended to replace advice given to you by your health care provider. Make sure you discuss any questions you have with your health care provider.

## 2013-10-23 NOTE — ED Provider Notes (Signed)
CSN: [id number]     Arrival date & time 10/23/13  1414 History   First MD Initiated Contact with Patient 10/23/13 1434     Chief Complaint  Patient presents with  . Feeding Intolerance     (Consider location/radiation/quality/duration/timing/severity/associated sxs/prior Treatment) HPI Victoria West is a 79 y.o. female who presents emergency department complaining of problems with her PEG tube. Patient from nursing home, history of CVA, diabetes, hypertension, feeding intolerance. According to family, patient pulled out her PEG tube at approximately 12 PM today. Patient did receive her medications and feeding this morning. Patient is nonverbal at baseline is unable to give any history, but she does not appear to be in any distress at this time. Family deny any nausea, vomiting, fever, chills, no other complaints.  Past Medical History  Diagnosis Date  . Stroke 2015  . Diabetes mellitus without complication   . Hypertension   . Anemia   . Stroke    Past Surgical History  Procedure Laterality Date  . Radiology with anesthesia N/A 09/25/2013    Procedure: RADIOLOGY WITH ANESTHESIA;  Surgeon: Oneal Grout, MD;  Location: [facility] OR;  Service: Radiology;  Laterality: N/A;  . Esophagogastroduodenoscopy N/A 10/06/2013    Procedure: ESOPHAGOGASTRODUODENOSCOPY (EGD);  Surgeon: Cherylynn Ridges, MD;  Location: [facility] ENDOSCOPY;  Service: General;  Laterality: N/A;  . Peg placement N/A 10/06/2013    Procedure: PERCUTANEOUS ENDOSCOPIC GASTROSTOMY (PEG) PLACEMENT;  Surgeon: Cherylynn Ridges, MD;  Location: [facility] ENDOSCOPY;  Service: General;  Laterality: N/A;  . Esophagogastroduodenoscopy N/A 10/07/2013    Procedure: ESOPHAGOGASTRODUODENOSCOPY (EGD);  Surgeon: Cherylynn Ridges, MD;  Location: [facility] ENDOSCOPY;  Service: General;  Laterality: N/A;  . Peg placement N/A 10/07/2013    Procedure: PERCUTANEOUS ENDOSCOPIC GASTROSTOMY (PEG) PLACEMENT;  Surgeon: Cherylynn Ridges, MD;  Location: [facility] ENDOSCOPY;  Service: General;   Laterality: N/A;   No family history on file. History  Substance Use Topics  . Smoking status: Never Smoker   . Smokeless tobacco: Never Used  . Alcohol Use: No   OB History   Grav Para Term Preterm Abortions TAB SAB Ect Mult Living                 Review of Systems  Unable to perform ROS: Patient nonverbal      Allergies  Review of patient's allergies indicates no known allergies.  Home Medications   Prior to Admission medications   Medication Sig Start Date End Date Taking? Authorizing Provider  Amino Acids-Protein Hydrolys (FEEDING SUPPLEMENT, PRO-STAT SUGAR FREE 64,) LIQD Place 30 mLs into feeding tube 2 times daily at 12 noon and 4 pm. 10/11/13   Layne Benton, NP  amLODipine (NORVASC) 10 MG tablet Place 1 tablet (10 mg total) into feeding tube daily. 10/11/13   Layne Benton, NP  aspirin 81 MG chewable tablet Place 1 tablet (81 mg total) into feeding tube daily. 10/11/13   Layne Benton, NP  bacitracin ointment Apply topically daily. 10/11/13   Layne Benton, NP  brimonidine-timolol (COMBIGAN) 0.2-0.5 % ophthalmic solution Place 1 drop into both eyes every 12 (twelve) hours.    Historical Provider, MD  brinzolamide (AZOPT) 1 % ophthalmic suspension Place 1 drop into both eyes 2 (two) times daily.    Historical Provider, MD  carvedilol (COREG) 6.25 MG tablet Place 1 tablet (6.25 mg total) into feeding tube 2 (two) times daily with a meal. 10/11/13   Layne Benton, NP  cloNIDine (CATAPRES) 0.1 MG tablet Take  1 [id number] [facility] Medical [id number]  pilocarpine (PILOCAR) 2 % ophthalmic solution Place 1 drop into both eyes 4 (four) times daily.    Historical Provider, MD  pravastatin (PRAVACHOL) 10 MG tablet Take 10 mg by mouth at bedtime.    Historical Provider, MD  sertraline (ZOLOFT) 100 MG tablet Take 100 mg by mouth daily. Take with 25mg  to equal total 125mg     Historical Provider, MD  sertraline (ZOLOFT) 25 MG tablet Take 25 mg by mouth daily. Take with 100mg  to equal total 125mg     Historical Provider, MD  Skin Protectants, Misc. (EUCERIN) cream Apply 1 application topically as needed for dry skin.    Historical Provider, MD  Travoprost, BAK Free, (TRAVATAN) 0.004 % SOLN ophthalmic solution Place 1 drop into the right eye at bedtime.    Historical Provider, MD   BP 180/69  Pulse 74  Temp(Src) 98.4 F (36.9 C) (Oral)  Resp 18  SpO2 99% Physical Exam  Nursing note and vitals  reviewed. Constitutional: She appears well-developed and well-nourished. No distress.  HENT:  Head: Normocephalic.  Eyes: Conjunctivae are normal.  Neck: Neck supple.  Cardiovascular: Normal rate, regular rhythm and normal heart sounds.   Pulmonary/Chest: Effort normal and breath sounds normal. No respiratory distress. She has no wheezes. She has no rales.  Abdominal: Soft. Bowel sounds are normal. She exhibits no distension. There is no tenderness. There is no rebound.  Stoma from the PEG tube in the left upper quadrant, no surrounding erythema, no drainage.  Musculoskeletal: She exhibits no edema.  Neurological: She is alert.  Nonverbal, does not follow directions.  Skin: Skin is warm and dry.  Psychiatric: She has a normal mood and affect. Her behavior is normal.    ED Course  Procedures (including criticaMMarykay [id number] Patrick [id number] [id number] Corporationevere [id number] AG>  Labs Reviewed - No data to display  Imaging Review Dg Abd 1 View  10/23/2013   CLINICAL DATA:  Peg tube placement.  Verify tube position.  EXAM: ABDOMEN - 1 VIEW  COMPARISON:  10/09/2013.  FINDINGS: Percutaneous gastrostomy tube is present. The balloon is in the antrum of the stomach and the duodenum bulb is opacified with contrast. Contrast extends to the proximal jejunum.  IMPRESSION: No extravasation of contrast. Percutaneous gastrostomy tube tip in the antrum of the stomach.   Electronically Signed   By: Andreas Newport M.D.   On: 10/23/2013 15:57     EKG Interpretation None      Gastrostomy tube replacement Performed by: Jaynie Crumble A Consent: Verbal consent obtained. Risks and benefits: risks, benefits and alternatives were discussed Required items: required blood products, implants, devices, and special equipment available Patient identity confirmed: hospital-assigned identification number Time out: Immediately prior to procedure a "time out" was called to verify the correct patient, procedure, equipment, support staff and site/side  marked as required. Preparation: Patient was prepped and draped in the usual sterile fashion. Patient tolerance: Patient tolerated the procedure well with no immediate complications.  Comments: 22 french Gastrostomy tube placed without difficulty  MDM   Final diagnoses:  PEG tube malfunction  Secondary hypertension, unspecified   Patient with history of CVA, nonverbal at baseline, coming from an assisted-living facility. Pulled out PEG approximately 2 hrs ago. Pt had new PEG tube just placed on 09/27/13. Replaced with 30 [place], with no difficulty. Will get x-ray to verify position.   4:08 PM X-ray showing peg tube in the right position with no extravasation of contrast. Pt will be discharged back to her SNF. Pt is hypertensive, will need to be rechecked closely and follow up with her doctor.   Filed Vitals:   10/23/13 1427  BP: 180/69  Pulse: 74  Temp: 98.4 F (36.9 C)  TempSrc: Oral  Resp: 18  SpO2: 99%     Skylor Hughson A Hyman Crossan, PA-C 10/23/13 1611

## 2013-10-23 NOTE — ED Notes (Signed)
Pt presents to department via [facility] from [facility] for evaluation of PEG tube issues. Per facility staff tube became dislodged this afternoon. History of stroke and dementia. No other complaints upon arrival to ED.

## 2013-10-23 NOTE — ED Notes (Signed)
[facility] at bedside to transport. 

## 2013-10-23 NOTE — ED Notes (Signed)
Notified [facility] for transport back home 

## 2013-10-23 NOTE — ED Notes (Signed)
PEG tube replaced with 22-f right angle G tube. Pt tolerated without difficulty. Pt to be have x-ray to verify.

## 2013-10-24 NOTE — ED Provider Notes (Signed)
Medical screening examination/treatment/procedure(s) were conducted as a shared visit with non-physician practitioner(s) and myself.  I personally evaluated the patient during the encounter.  Pt w dislodged peg tube. No emesis. No fevers. abd soft nt. Tube replaced easily by PA.  Tube injection study/kub.    Suzi Roots, MD 10/24/13 1600

## 2013-10-25 ENCOUNTER — Emergency Department (HOSPITAL_COMMUNITY)
Admission: EM | Admit: 2013-10-25 | Discharge: 2013-10-25 | Disposition: A | Discharge status: Home / Self Care | Payer: Medicare Other | Attending: Emergency Medicine | Admitting: Emergency Medicine

## 2013-10-25 ENCOUNTER — Emergency Department (HOSPITAL_COMMUNITY): Payer: Medicare Other

## 2013-10-25 ENCOUNTER — Encounter (HOSPITAL_COMMUNITY): Payer: Self-pay | Admitting: Emergency Medicine

## 2013-10-25 DIAGNOSIS — Z7982 Long term (current) use of aspirin: Secondary | ICD-10-CM | POA: Diagnosis not present

## 2013-10-25 DIAGNOSIS — Z79899 Other long term (current) drug therapy: Secondary | ICD-10-CM | POA: Diagnosis not present

## 2013-10-25 DIAGNOSIS — I10 Essential (primary) hypertension: Secondary | ICD-10-CM | POA: Diagnosis not present

## 2013-10-25 DIAGNOSIS — D649 Anemia, unspecified: Secondary | ICD-10-CM | POA: Insufficient documentation

## 2013-10-25 DIAGNOSIS — E119 Type 2 diabetes mellitus without complications: Secondary | ICD-10-CM | POA: Diagnosis not present

## 2013-10-25 DIAGNOSIS — Z431 Encounter for attention to gastrostomy: Secondary | ICD-10-CM

## 2013-10-25 DIAGNOSIS — Z8673 Personal history of transient ischemic attack (TIA), and cerebral infarction without residual deficits: Secondary | ICD-10-CM | POA: Insufficient documentation

## 2013-10-25 MED ORDER — IOHEXOL 300 MG/ML  SOLN
50.0000 mL | Freq: Once | INTRAMUSCULAR | Status: AC | PRN
  Administered 2013-10-25: 50 mL via INTRAVENOUS

## 2013-10-25 NOTE — Discharge Instructions (Signed)
Return here as needed. Follow up with your doctor. °

## 2013-10-25 NOTE — ED Notes (Signed)
81 yo female from [facility]. C/o Displaced PEG tube. Pt recently here on Saturday with same issue. Pt at baseline. Vitals stable, foley cath in place. HX of stroke, BKA.

## 2013-10-25 NOTE — ED Notes (Signed)
PA at bedside. PEG in place. Skin around area of PEG insertion is clean dry and intact. Peg secured with paper tape. Communication with Radiology for scan.

## 2013-10-25 NOTE — ED Notes (Signed)
[facility] here for transport. Pt was cleaned and diaper changed. Pt in NAD. Family updated and facility notified.

## 2013-10-25 NOTE — ED Notes (Signed)
Patient is resting comfortably. Family at bedside.  

## 2013-10-25 NOTE — ED Provider Notes (Signed)
CSN: [id number]     Arrival date & time 10/25/13  7829 History   First MD Initiated Contact with Patient 10/25/13 [phone]     Chief Complaint  Patient presents with  . Illegal value: [    PEG Displaced     (Consider location/radiation/quality/duration/timing/severity/associated sxs/prior Treatment) HPI Patient presents emergency department for evaluation of a dislodged PEG tube.  Patient is from a nursing facility and is unable to give any history.  Patient was seen on Saturday for similar issues and the tube was replaced.  Patient has not had any vomiting, or fevers per the nursing home report Past Medical History  Diagnosis Date  . Stroke 2015  . Diabetes mellitus without complication   . Hypertension   . Anemia   . Stroke    Past Surgical History  Procedure Laterality Date  . Radiology with anesthesia N/A 09/25/2013    Procedure: RADIOLOGY WITH ANESTHESIA;  Surgeon: Oneal Grout, MD;  Location: [facility] OR;  Service: Radiology;  Laterality: N/A;  . Esophagogastroduodenoscopy N/A 10/06/2013    Procedure: ESOPHAGOGASTRODUODENOSCOPY (EGD);  Surgeon: Cherylynn Ridges, MD;  Location: [facility] ENDOSCOPY;  Service: General;  Laterality: N/A;  . Peg placement N/A 10/06/2013    Procedure: PERCUTANEOUS ENDOSCOPIC GASTROSTOMY (PEG) PLACEMENT;  Surgeon: Cherylynn Ridges, MD;  Location: [facility] ENDOSCOPY;  Service: General;  Laterality: N/A;  . Esophagogastroduodenoscopy N/A 10/07/2013    Procedure: ESOPHAGOGASTRODUODENOSCOPY (EGD);  Surgeon: Cherylynn Ridges, MD;  Location: [facility] ENDOSCOPY;  Service: General;  Laterality: N/A;  . Peg placement N/A 10/07/2013    Procedure: PERCUTANEOUS ENDOSCOPIC GASTROSTOMY (PEG) PLACEMENT;  Surgeon: Cherylynn Ridges, MD;  Location: [facility] ENDOSCOPY;  Service: General;  Laterality: N/A;   History reviewed. No pertinent family history. History  Substance Use Topics  . Smoking status: Never Smoker   . Smokeless tobacco: Never Used  . Alcohol Use: No   OB History   Grav Para Term Preterm  Abortions TAB SAB Ect Mult Living                 Review of Systems  Level 5 caveat applies due to dementia  Allergies  Review of patient's allergies indicates no known allergies.  Home Medications   Prior to Admission medications   Medication Sig Start Date End Date Taking? Authorizing Provider  acetaminophen (TYLENOL) 500 MG tablet 1,000 mg by PEG Tube route 3 (three) times daily.   Yes Historical Provider, MD  Amino Acids-Protein Hydrolys (FEEDING SUPPLEMENT, PRO-STAT SUGAR FREE 64,) LIQD Place 30 mLs into feeding tube daily.   Yes Historical Provider, MD  amLODipine (NORVASC) 10 MG tablet Place 1 tablet (10 mg total) into feeding tube daily. 10/11/13  Yes Layne Benton, NP  aspirin 81 MG chewable tablet Place 1 tablet (81 mg total) into feeding tube daily. 10/11/13  Yes Layne Benton, NP  carvedilol (COREG) 3.125 MG tablet 3.125 mg by PEG Tube route 2 (two) times daily with a meal.   Yes Historical Provider, MD  cloNIDine (CATAPRES) 0.1 MG tablet 0.1 mg by PEG Tube route 2 (two) times daily.   Yes Historical Provider, MD  dorzolamide-timolol (COSOPT) 22.3-6.8 MG/ML ophthalmic solution Place 1 drop into both eyes 2 (two) times daily.   Yes Historical Provider, MD  fluconazole (DIFLUCAN) 100 MG tablet 100 mg by PEG Tube route daily.   Yes Historical Provider, MD  hydrALAZINE (APRESOLINE) 100 MG tablet 100 mg by PEG Tube route 3 (three) times daily.    Yes Historical Provider, MD  iron polysaccharides (NIFEREX) 150 MG capsule Place 1 capsule (150 mg total) into feeding tube daily. 10/11/13  Yes Layne Benton, NP  lisinopril (PRINIVIL,ZESTRIL) 20 MG tablet 20 mg by PEG Tube route 2 (two) times daily.    Yes Historical Provider, MD  LORazepam (ATIVAN) 0.5 MG tablet 0.5 mg by PEG Tube route 2 (two) times daily.   Yes Historical Provider, MD  methazolamide (NEPTAZANE) 25 MG tablet Place 1 tablet (25 mg total) into feeding tube 2 (two) times daily. 10/11/13  Yes Layne Benton, NP  methimazole  (TAPAZOLE) 5 MG tablet Place 1 tablet (5 mg total) into feeding tube daily. Take ONCE daily on Mon, Wed, & Fri. Take TWICE daily on Tues, Thurs, Sat, & SunTake 5 mg by mouth daily. Take ONCE daily on Mon, Wed, & Fri. Take TWICE daily on Tues, Thurs, Sat, & Sun 10/11/13  Yes Layne Benton, NP  Multiple Vitamins-Minerals (DECUBI-VITE PO) 1 tablet by PEG Tube route daily.   Yes Historical Provider, MD  nitroGLYCERIN (NITRODUR - DOSED IN MG/24 HR) 0.4 mg/hr patch Place 0.4 mg onto the skin daily.   Yes Historical Provider, MD  Nutritional Supplements (FEEDING SUPPLEMENT, OSMOLITE 1.2 CAL,) LIQD Place 1,000 mLs into feeding tube continuous. Start at 53ml/hr increase by 10ml every 4 hours to goal of 17ml/hr 10/11/13  Yes Layne Benton, NP  polyethylene glycol (MIRALAX / GLYCOLAX) packet 17 g by PEG Tube route 2 (two) times daily.   Yes Historical Provider, MD  pravastatin (PRAVACHOL) 10 MG tablet 10 mg by PEG Tube route at bedtime.    Yes Historical Provider, MD  sertraline (ZOLOFT) 100 MG tablet Take 100 mg by mouth daily. Take with  to equal total    Yes Historical Provider, MD  sertraline (ZOLOFT) 25 MG tablet Take 25 mg by mouth daily. Take with  to equal total    Yes Historical Provider, MD  Skin Protectants, Misc. (EUCERIN) cream Apply 1 application topically 2 (two) times daily. Left leg and foot   Yes Historical Provider, MD  Travoprost, BAK Free, (TRAVATAN) 0.004 % SOLN ophthalmic solution Place 1 drop into the right eye at bedtime.   Yes Historical Provider, MD   BP 171/66  Pulse 61  Temp(Src) 97.8 F (36.6 C) (Tympanic)  Resp 13  SpO2 100% Physical Exam  Nursing note and vitals reviewed. Constitutional: She is oriented to person, place, and time. She appears well-developed and well-nourished. No distress.  HENT:  Head: Normocephalic and atraumatic.  Cardiovascular: Normal rate and regular rhythm.   Pulmonary/Chest: Effort normal and breath sounds normal.  Abdominal:   There is a PEG tube in place, and the balloon is inflated with 6 cc of saline  Neurological: She is alert and oriented to person, place, and time.  Skin: Skin is warm and dry. No erythema.    ED Course  Procedures (including critical care time) Labs Review Labs Reviewed - No data to display  Imaging Review Dg Abd 1 View  10/25/2013   CLINICAL DATA:  PEG tube placement.  EXAM: ABDOMEN - 1 VIEW  COMPARISON:  One-view abdomen 10/23/2013.  FINDINGS: Injection of 50 mL Omnipaque demonstrates opacification of the stomach and duodenum bulb. There is no extravasation. Gaseous distention of the colon has increased.  IMPRESSION: 1. The feeding tube is in the stomach. 2. Increasing gaseous distention of the colon.   Electronically Signed   By: Gennette Pac M.D.   On: 10/25/2013 11:24   Dg Abd 1  View  10/23/2013   CLINICAL DATA:  Peg tube placement.  Verify tube position.  EXAM: ABDOMEN - 1 VIEW  COMPARISON:  10/09/2013.  FINDINGS: Percutaneous gastrostomy tube is present. The balloon is in the antrum of the stomach and the duodenum bulb is opacified with contrast. Contrast extends to the proximal jejunum.  IMPRESSION: No extravasation of contrast. Percutaneous gastrostomy tube tip in the antrum of the stomach.   Electronically Signed   By: Andreas Newport M.D.   On: 10/23/2013 15:57    Patient will be referred back to her primary care Dr. told to return here as needed    Carlyle Dolly, PA-C 10/25/13 1221

## 2013-10-25 NOTE — ED Notes (Signed)
Patient transported to X-ray 

## 2013-10-25 NOTE — ED Provider Notes (Signed)
Medical screening examination/treatment/procedure(s) were conducted as a shared visit with non-physician practitioner(s) or resident and myself. I personally evaluated the patient during the encounter and agree with the findings.  I have personally reviewed any xrays and/ or EKG's with the provider and I agree with interpretation.  80 year old female from the nursing home, recent stroke, G-tube dependent and diabetic presents with possible displacement of G-tube. Family, her son is in the room and that's the only concern at this time, patient neurologically at baseline since stroke. Patient constantly pulse of the tube. No fevers or drainage. On exam G-tube appears to be in place, nurse will attempt to place normal saline and aspirated. X-ray pending. Vitals unremarkable except for mild hypertension. Patient minimal verbal exam which is her baseline.  Dg Abd 1 View  10/25/2013   CLINICAL DATA:  PEG tube placement.  EXAM: ABDOMEN - 1 VIEW  COMPARISON:  One-view abdomen 10/23/2013.  FINDINGS: Injection of 50 mL Omnipaque demonstrates opacification of the stomach and duodenum bulb. There is no extravasation. Gaseous distention of the colon has increased.  IMPRESSION: 1. The feeding tube is in the stomach. 2. Increasing gaseous distention of the colon.   Electronically Signed   By: Gennette Pac M.D.   On: 10/25/2013 11:24    G-tube displacement      Enid Skeens, MD 10/25/13 [phone]

## 2013-11-22 ENCOUNTER — Encounter (HOSPITAL_COMMUNITY): Payer: Self-pay | Admitting: Emergency Medicine

## 2013-11-22 ENCOUNTER — Inpatient Hospital Stay (HOSPITAL_COMMUNITY)
Admission: EM | Admit: 2013-11-22 | Discharge: 2013-12-12 | DRG: 871 | Disposition: E | Payer: Medicare Other | Attending: Internal Medicine | Admitting: Internal Medicine

## 2013-11-22 ENCOUNTER — Emergency Department (HOSPITAL_COMMUNITY): Payer: Medicare Other

## 2013-11-22 DIAGNOSIS — I5030 Unspecified diastolic (congestive) heart failure: Secondary | ICD-10-CM | POA: Diagnosis present

## 2013-11-22 DIAGNOSIS — E0811 Diabetes mellitus due to underlying condition with ketoacidosis with coma: Secondary | ICD-10-CM

## 2013-11-22 DIAGNOSIS — S0181XA Laceration without foreign body of other part of head, initial encounter: Secondary | ICD-10-CM | POA: Diagnosis present

## 2013-11-22 DIAGNOSIS — E119 Type 2 diabetes mellitus without complications: Secondary | ICD-10-CM

## 2013-11-22 DIAGNOSIS — I6340 Cerebral infarction due to embolism of unspecified cerebral artery: Secondary | ICD-10-CM | POA: Diagnosis present

## 2013-11-22 DIAGNOSIS — E0590 Thyrotoxicosis, unspecified without thyrotoxic crisis or storm: Secondary | ICD-10-CM | POA: Diagnosis present

## 2013-11-22 DIAGNOSIS — I10 Essential (primary) hypertension: Secondary | ICD-10-CM | POA: Diagnosis present

## 2013-11-22 DIAGNOSIS — Z7982 Long term (current) use of aspirin: Secondary | ICD-10-CM | POA: Diagnosis not present

## 2013-11-22 DIAGNOSIS — N390 Urinary tract infection, site not specified: Secondary | ICD-10-CM | POA: Diagnosis present

## 2013-11-22 DIAGNOSIS — L89159 Pressure ulcer of sacral region, unspecified stage: Secondary | ICD-10-CM | POA: Diagnosis present

## 2013-11-22 DIAGNOSIS — Z79899 Other long term (current) drug therapy: Secondary | ICD-10-CM | POA: Diagnosis not present

## 2013-11-22 DIAGNOSIS — E1100 Type 2 diabetes mellitus with hyperosmolarity without nonketotic hyperglycemic-hyperosmolar coma (NKHHC): Secondary | ICD-10-CM | POA: Diagnosis present

## 2013-11-22 DIAGNOSIS — X58XXXA Exposure to other specified factors, initial encounter: Secondary | ICD-10-CM | POA: Diagnosis present

## 2013-11-22 DIAGNOSIS — F0390 Unspecified dementia without behavioral disturbance: Secondary | ICD-10-CM | POA: Diagnosis present

## 2013-11-22 DIAGNOSIS — Z66 Do not resuscitate: Secondary | ICD-10-CM | POA: Diagnosis present

## 2013-11-22 DIAGNOSIS — E1310 Other specified diabetes mellitus with ketoacidosis without coma: Secondary | ICD-10-CM | POA: Diagnosis present

## 2013-11-22 DIAGNOSIS — G9340 Encephalopathy, unspecified: Secondary | ICD-10-CM | POA: Diagnosis present

## 2013-11-22 DIAGNOSIS — R4182 Altered mental status, unspecified: Secondary | ICD-10-CM | POA: Diagnosis not present

## 2013-11-22 DIAGNOSIS — Z8673 Personal history of transient ischemic attack (TIA), and cerebral infarction without residual deficits: Secondary | ICD-10-CM

## 2013-11-22 DIAGNOSIS — R6520 Severe sepsis without septic shock: Secondary | ICD-10-CM | POA: Diagnosis present

## 2013-11-22 DIAGNOSIS — E785 Hyperlipidemia, unspecified: Secondary | ICD-10-CM | POA: Diagnosis present

## 2013-11-22 DIAGNOSIS — A419 Sepsis, unspecified organism: Principal | ICD-10-CM | POA: Diagnosis present

## 2013-11-22 DIAGNOSIS — R600 Localized edema: Secondary | ICD-10-CM | POA: Insufficient documentation

## 2013-11-22 DIAGNOSIS — R0681 Apnea, not elsewhere classified: Secondary | ICD-10-CM | POA: Diagnosis present

## 2013-11-22 DIAGNOSIS — R739 Hyperglycemia, unspecified: Secondary | ICD-10-CM

## 2013-11-22 DIAGNOSIS — E870 Hyperosmolality and hypernatremia: Secondary | ICD-10-CM | POA: Diagnosis present

## 2013-11-22 DIAGNOSIS — I509 Heart failure, unspecified: Secondary | ICD-10-CM

## 2013-11-22 DIAGNOSIS — N179 Acute kidney failure, unspecified: Secondary | ICD-10-CM | POA: Diagnosis present

## 2013-11-22 DIAGNOSIS — R140 Abdominal distension (gaseous): Secondary | ICD-10-CM | POA: Diagnosis present

## 2013-11-22 DIAGNOSIS — E860 Dehydration: Secondary | ICD-10-CM | POA: Diagnosis present

## 2013-11-22 DIAGNOSIS — S01512A Laceration without foreign body of oral cavity, initial encounter: Secondary | ICD-10-CM | POA: Diagnosis present

## 2013-11-22 DIAGNOSIS — I5032 Chronic diastolic (congestive) heart failure: Secondary | ICD-10-CM | POA: Diagnosis present

## 2013-11-22 DIAGNOSIS — E13641 Other specified diabetes mellitus with hypoglycemia with coma: Secondary | ICD-10-CM

## 2013-11-22 LAB — CBC WITH DIFFERENTIAL/PLATELET
Basophils Absolute: 0 10*3/uL (ref 0.0–0.1)
Basophils Relative: 0 % (ref 0–1)
Eosinophils Absolute: 0 10*3/uL (ref 0.0–0.7)
Eosinophils Relative: 0 % (ref 0–5)
HCT: 44.6 % (ref 36.0–46.0)
HEMOGLOBIN: 12.9 g/dL (ref 12.0–15.0)
LYMPHS PCT: 4 % — AB (ref 12–46)
Lymphs Abs: 0.9 10*3/uL (ref 0.7–4.0)
MCH: 33.7 pg (ref 26.0–34.0)
MCHC: 28.9 g/dL — ABNORMAL LOW (ref 30.0–36.0)
MCV: 116.4 fL — ABNORMAL HIGH (ref 78.0–100.0)
MONOS PCT: 2 % — AB (ref 3–12)
Monocytes Absolute: 0.4 10*3/uL (ref 0.1–1.0)
Neutro Abs: 20.7 10*3/uL — ABNORMAL HIGH (ref 1.7–7.7)
Neutrophils Relative %: 94 % — ABNORMAL HIGH (ref 43–77)
PLATELETS: 333 10*3/uL (ref 150–400)
RBC: 3.83 MIL/uL — AB (ref 3.87–5.11)
RDW: 19.2 % — ABNORMAL HIGH (ref 11.5–15.5)
WBC: 22 10*3/uL — AB (ref 4.0–10.5)

## 2013-11-22 LAB — COMPREHENSIVE METABOLIC PANEL
ALK PHOS: 118 U/L — AB (ref 39–117)
ALT: 55 U/L — AB (ref 0–35)
AST: 38 U/L — ABNORMAL HIGH (ref 0–37)
Albumin: 2.3 g/dL — ABNORMAL LOW (ref 3.5–5.2)
Anion gap: 21 — ABNORMAL HIGH (ref 5–15)
BUN: 98 mg/dL — ABNORMAL HIGH (ref 6–23)
CALCIUM: 8.9 mg/dL (ref 8.4–10.5)
CO2: 19 meq/L (ref 19–32)
Chloride: 117 mEq/L — ABNORMAL HIGH (ref 96–112)
Creatinine, Ser: 1.42 mg/dL — ABNORMAL HIGH (ref 0.50–1.10)
GFR calc Af Amer: 37 mL/min — ABNORMAL LOW (ref >90)
GFR, EST NON AFRICAN AMERICAN: 32 mL/min — AB (ref >90)
Glucose, Bld: 1189 mg/dL (ref 70–99)
POTASSIUM: 4.7 meq/L (ref 3.7–5.3)
SODIUM: 157 meq/L — AB (ref 137–147)
Total Bilirubin: <0.2 mg/dL — ABNORMAL LOW (ref 0.3–1.2)
Total Protein: 6.4 g/dL (ref 6.0–8.3)

## 2013-11-22 LAB — URINALYSIS, ROUTINE W REFLEX MICROSCOPIC
Bilirubin Urine: NEGATIVE
GLUCOSE, UA: NEGATIVE mg/dL
Ketones, ur: NEGATIVE mg/dL
Nitrite: NEGATIVE
Protein, ur: NEGATIVE mg/dL
Specific Gravity, Urine: 1 — ABNORMAL LOW (ref 1.005–1.030)
Urobilinogen, UA: 0.2 mg/dL (ref 0.0–1.0)
pH: 5.5 (ref 5.0–8.0)

## 2013-11-22 LAB — URINE MICROSCOPIC-ADD ON

## 2013-11-22 LAB — TROPONIN I

## 2013-11-22 LAB — I-STAT CG4 LACTIC ACID, ED: LACTIC ACID, VENOUS: 4.71 mmol/L — AB (ref 0.5–2.2)

## 2013-11-22 LAB — CBG MONITORING, ED: Glucose-Capillary: >600 mg/dL (ref 70–99)

## 2013-11-22 MED ORDER — SODIUM CHLORIDE 0.9 % IV SOLN: 1000.0000 mL | INTRAVENOUS | Status: DC

## 2013-11-22 MED ORDER — SODIUM CHLORIDE 0.9 % IV SOLN
INTRAVENOUS | Status: DC
  Administered 2013-11-22: 5.4 [IU]/h via INTRAVENOUS
  Filled 2013-11-22: qty 2.5

## 2013-11-22 MED ORDER — VANCOMYCIN HCL IN DEXTROSE 1-5 GM/200ML-% IV SOLN
1000.0000 mg | Freq: Once | INTRAVENOUS | Status: AC
  Administered 2013-11-22: 1000 mg via INTRAVENOUS
  Filled 2013-11-22: qty 200

## 2013-11-22 MED ORDER — SODIUM CHLORIDE 0.9 % IV BOLUS (SEPSIS)
1000.0000 mL | Freq: Once | INTRAVENOUS | Status: AC
  Administered 2013-11-22: 1000 mL via INTRAVENOUS

## 2013-11-22 MED ORDER — LORAZEPAM 2 MG/ML IJ SOLN
0.5000 mg | Freq: Once | INTRAMUSCULAR | Status: AC
  Administered 2013-11-22: 0.5 mg via INTRAVENOUS
  Filled 2013-11-22: qty 1

## 2013-11-22 MED ORDER — SODIUM CHLORIDE 0.9 % IV BOLUS (SEPSIS)
30.0000 mL/kg | Freq: Once | INTRAVENOUS | Status: AC
  Administered 2013-11-22: 1770 mL via INTRAVENOUS

## 2013-11-22 MED ORDER — PIPERACILLIN-TAZOBACTAM 3.375 G IVPB 30 MIN
3.3750 g | Freq: Once | INTRAVENOUS | Status: AC
  Administered 2013-11-22: 3.375 g via INTRAVENOUS
  Filled 2013-11-22: qty 50

## 2013-11-22 MED ORDER — SODIUM CHLORIDE 0.9 % IV SOLN
Freq: Once | INTRAVENOUS | Status: AC
  Administered 2013-11-22: 1000 mL via INTRAVENOUS

## 2013-11-22 MED ORDER — DEXTROSE-NACL 5-0.45 % IV SOLN: INTRAVENOUS | Status: DC

## 2013-11-25 LAB — URINE CULTURE: Colony Count: >=100000

## 2013-11-28 LAB — CULTURE, BLOOD (ROUTINE X 2)
Culture: NO GROWTH
Culture: NO GROWTH

## 2013-12-12 NOTE — ED Notes (Signed)
Per ED Physician, cannot get in touch with PCP, if funeral home can't reach PCP either, Dr. Wilkie AyeHorton states is willing to sign death certificate. Send to her if necessary.

## 2013-12-12 NOTE — ED Notes (Signed)
edp just placed a ultra sound iv lt a-c

## 2013-12-12 NOTE — ED Notes (Signed)
i have called the pts son at home we have the funeral home listed on [place] papers. He is not coming in.  [place] donor contacted.  This will not be a donor.  nio further needed.   April 05, 2013-024 [place] frm the donor service

## 2013-12-12 NOTE — ED Notes (Signed)
Pt taken to CT.

## 2013-12-12 NOTE — ED Notes (Addendum)
Per ems- pt from [facility]. Staff noticed tongue trauma at 1100p. No known seizure. Pt has been lethargic and having repetitive movements with mouth. Family sts this is not normal for her. cbg read "high" with ems.

## 2013-12-12 NOTE — ED Notes (Signed)
Pt bradycardia and stopped breathing.  dnr

## 2013-12-12 NOTE — ED Notes (Signed)
The pt is grinding her teeth.  No urine  Output from foley

## 2013-12-12 NOTE — ED Provider Notes (Addendum)
CSN: [id number]     Arrival date & time 10-31-13  0147 History   First MD Initiated Contact with Patient 10-31-13 0156     Chief Complaint  Patient presents with  . Facial Laceration     (Consider location/radiation/quality/duration/timing/severity/associated sxs/prior Treatment) HPI   Level V caveat for dementia  This is an 80 year old female who presents from her nursing home.  Per EMS report, nursing staff noted patient had rhythmic movements of her tongue and a noted trauma in her mouth. At baseline she is lethargic. Son is at the bedside. States that at baseline she does not talk but will sometimes squeeze her hand. He notes that she is "a lot more sleepy and lethargic than normal." She does not normally bite her tongue.  She is DO NOT RESUSCITATE/DO NOT INTUBATE.  She has a PEG tube. Patient is noncontributory to history taking.  Past Medical History  Diagnosis Date  . Stroke 2015  . Diabetes mellitus without complication   . Hypertension   . Anemia   . Stroke    Past Surgical History  Procedure Laterality Date  . Radiology with anesthesia N/A 09/25/2013    Procedure: RADIOLOGY WITH ANESTHESIA;  Surgeon: Oneal GroutSanjeev K Deveshwar, MD;  Location: [facility] OR;  Service: Radiology;  Laterality: N/A;  . Esophagogastroduodenoscopy N/A 10/06/2013    Procedure: ESOPHAGOGASTRODUODENOSCOPY (EGD);  Surgeon: Cherylynn RidgesJames O Wyatt, MD;  [facility];  Service: General;  Laterality: N/A;  . Peg placement N/A 10/06/2013    Procedure: PERCUTANEOUS ENDOSCOPIC GASTROSTOMY (PEG) PLACEMENT;  Surgeon: Cherylynn RidgesJames O Wyatt, MD;  [facility];  Service: General;  Laterality: N/A;  . Esophagogastroduodenoscopy N/A 10/07/2013    Procedure: ESOPHAGOGASTRODUODENOSCOPY (EGD);  Surgeon: Cherylynn RidgesJames O Wyatt, MD;  [facility];  Service: General;  Laterality: N/A;  . Peg placement N/A 10/07/2013    Procedure: PERCUTANEOUS ENDOSCOPIC GASTROSTOMY (PEG) PLACEMENT;  Surgeon: Cherylynn RidgesJames O Wyatt, MD;  [facility];  Service: General;  Laterality: N/A;   No family history on file. History  Substance Use Topics  . Smoking status: Never Smoker   . Smokeless tobacco: Never Used  . Alcohol Use: No   OB History   Grav Para Term Preterm Abortions TAB SAB Ect Mult Living                 Review of Systems  Unable to perform ROS: Patient nonverbal      Allergies  Review of patient's allergies indicates no known allergies.  Home Medications   Prior to Admission medications   Medication Sig Start Date End Date Taking? Authorizing Provider  acetaminophen (TYLENOL) 500 MG tablet 1,000 mg by PEG Tube route 3 (three) times daily.   Yes Historical Provider, MD  amLODipine (NORVASC) 10 MG tablet Place 1 tablet (10 mg total) into feeding tube daily. 10/11/13  Yes Layne BentonSharon L Biby, NP  aspirin 81 MG chewable tablet Place 1 tablet (81 mg total) into feeding tube daily. 10/11/13  Yes Layne BentonSharon L Biby, NP  carvedilol (COREG) 6.25 MG tablet Take 6.25 mg by mouth 2 (two) times daily with a meal.   Yes Historical Provider, MD  cloNIDine (CATAPRES) 0.1 MG tablet 0.1 mg by PEG Tube route 2 (two) times daily.   Yes Historical Provider, MD  dorzolamide-timolol (COSOPT) 22.3-6.8 MG/ML ophthalmic solution Place 1 drop into both eyes 2 (two) times daily.   Yes Historical Provider, MD  hydrALAZINE (APRESOLINE) 100 MG tablet 100 mg by PEG Tube route 3 (three) times daily.  Yes Historical Provider, MD  hydrALAZINE (APRESOLINE) 25 MG tablet 25 mg by PEG Tube route 3 (three) times daily. Give with 100mg  tablet to = 125mg /dose   Yes Historical Provider, MD  iron polysaccharides (NIFEREX) 150 MG capsule Place 1 capsule (150 mg total) into feeding tube daily. 10/11/13  Yes Layne BentonSharon L Biby, NP  lisinopril (PRINIVIL,ZESTRIL) 20 MG tablet 20 mg by PEG Tube route 2 (two) times daily.    Yes Historical Provider, MD  LORazepam (ATIVAN) 0.5 MG tablet 0.5 mg by PEG Tube route 3 (three) times daily.   Yes Historical Provider, MD   methimazole (TAPAZOLE) 5 MG tablet 5 mg by PEG Tube route See admin instructions. Give 1 tablet daily via tube on Monday, Wednesday and Friday. Give 1 tablet twice daily on Tuesday, Thursday, Saturday and Sunday.   Yes Historical Provider, MD  Multiple Vitamins-Minerals (DECUBI-VITE PO) 1 tablet by PEG Tube route daily.   Yes Historical Provider, MD  nitroGLYCERIN (NITRODUR - DOSED IN MG/24 HR) 0.4 mg/hr patch Place 0.4 mg onto the skin daily.   Yes Historical Provider, MD  Nutritional Supplements (FEEDING SUPPLEMENT, OSMOLITE 1.2 CAL,) LIQD Place 1,000 mLs into feeding tube continuous. 10/11/13  Yes Layne BentonSharon L Biby, NP  pravastatin (PRAVACHOL) 10 MG tablet 10 mg by PEG Tube route at bedtime.    Yes Historical Provider, MD  sertraline (ZOLOFT) 100 MG tablet Take 100 mg by mouth daily. Take with 25mg  to equal total 125mg    Yes Historical Provider, MD  sertraline (ZOLOFT) 25 MG tablet Take 25 mg by mouth daily. Take with 100mg  to equal total 125mg    Yes Historical Provider, MD  Skin Protectants, Misc. (EUCERIN) cream Apply 1 application topically 2 (two) times daily. Left leg and foot   Yes Historical Provider, MD  Travoprost, BAK Free, (TRAVATAN) 0.004 % SOLN ophthalmic solution Place 1 drop into the right eye at bedtime.   Yes Historical Provider, MD  UNABLE TO FIND 2 scoop by PEG Tube route daily. Med Name: Procell powder-add to 4-6 ounces of water   Yes Historical Provider, MD   BP 87/39  Pulse 72  Temp(Src) 99.9 F (37.7 C) (Core (Comment))  Resp 30  Ht 5' 4.17" (1.63 m)  Wt 130 lb (58.968 kg)  BMI 22.19 kg/m2  SpO2 92% Physical Exam  Nursing note and vitals reviewed. Constitutional:  Lethargic, ill appearing, elderly  HENT:  Head: Normocephalic and atraumatic.  Mucous membranes dry, multiple tongue lacerations noted with dry blood about the oropharynx  Eyes: Pupils are equal, round, and reactive to light.  Neck: Neck supple.  Cardiovascular: Normal rate, regular rhythm and normal  heart sounds.   No murmur heard. Pulmonary/Chest: Effort normal. No respiratory distress.  Coarse breath sounds bilaterally  Abdominal: Soft. Bowel sounds are normal.  PEG tube in place without surrounding erythema  Genitourinary:  Foley catheter in place with purulent urine  Musculoskeletal: She exhibits no edema.  Neurological:  Lethargic, moves all 4 extremities spontaneously  Skin: Skin is warm and dry.  Sacral decubitus ulcer  Psychiatric:  Unable to assess    ED Course  Procedures (including critical care time)  CRITICAL CARE Performed by: Ross MarcusHORTON, COURTNEY, F   Total critical care time: 60 min  Critical care time was exclusive of separately billable procedures and treating other patients.  Critical care was necessary to treat or prevent imminent or life-threatening deterioration.  Critical care was time spent personally by me on the following activities: development of treatment plan with patient  and/or surrogate as well as nursing, discussions with consultants, evaluation of patient's response to treatment, examination of patient, obtaining history from patient or surrogate, ordering and performing treatments and interventions, ordering and review of laboratory studies, ordering and review of radiographic studies, pulse oximetry and re-evaluation of patient's condition.  Angiocath insertion Performed by: Ross Marcus, F  Consent: Verbal consent obtained. Risks and benefits: risks, benefits and alternatives were discussed Time out: Immediately prior to procedure a "time out" was called to verify the correct patient, procedure, equipment, support staff and site/side marked as required.  Preparation: Patient was prepped and draped in the usual sterile fashion.  Vein Location: left antecubital fossa  Ultrasound Guided  Gauge: 20  Normal blood return and flush without difficulty Patient tolerance: Patient tolerated the procedure well with no immediate  complications.    Labs Review Labs Reviewed  CBC WITH DIFFERENTIAL - Abnormal; Notable for the following:    WBC 22.0 (*)    RBC 3.83 (*)    MCV 116.4 (*)    MCHC 28.9 (*)    RDW 19.2 (*)    Neutrophils Relative % 94 (*)    Lymphocytes Relative 4 (*)    Monocytes Relative 2 (*)    Neutro Abs 20.7 (*)    All other components within normal limits  COMPREHENSIVE METABOLIC PANEL - Abnormal; Notable for the following:    Sodium 157 (*)    Chloride 117 (*)    Glucose, Bld 1189 (*)    BUN 98 (*)    Creatinine, Ser 1.42 (*)    Albumin 2.3 (*)    AST 38 (*)    ALT 55 (*)    Alkaline Phosphatase 118 (*)    Total Bilirubin <0.2 (*)    GFR calc non Af Amer 32 (*)    GFR calc Af Amer 37 (*)    Anion gap 21 (*)    All other components within normal limits  I-STAT CG4 LACTIC ACID, ED - Abnormal; Notable for the following:    Lactic Acid, Venous 4.71 (*)    All other components within normal limits  CULTURE, BLOOD (ROUTINE X 2)  CULTURE, BLOOD (ROUTINE X 2)  URINE CULTURE  TROPONIN I  URINALYSIS, ROUTINE W REFLEX MICROSCOPIC  BASIC METABOLIC PANEL  CBG MONITORING, ED    Imaging Review Ct Head Wo Contrast  13-Dec-2013   CLINICAL DATA:  Acute injury, tongue trauma at 11 p.m. No known seizure. Lethargy. Abnormal mouth movements.  EXAM: CT HEAD WITHOUT CONTRAST  TECHNIQUE: Contiguous axial images were obtained from the base of the skull through the vertex without intravenous contrast.  COMPARISON:  CT of the head October 02, 2013  FINDINGS: Moderately motion degraded examination, study was re-attempted with no significant image quality improvement.  No intraparenchymal hemorrhage, mass effect or midline shift. Severe ventriculomegaly, increased from prior examination. Anterior recess of the third ventricle is 16 mm, previously 11 mm. No intraparenchymal hemorrhage, midline shift. Confluent periventricular and deep white matter hypodensities, increased from prior examination. LEFT  frontotemporal parietal encephalomalacia. No acute large vascular territory infarct.  No abnormal extra-axial fluid collections. Severe calcific atherosclerosis of the carotid siphons. No skull fracture. RIGHT sphenoid sinus air-fluid level. Status post bilateral ocular lens implants.  IMPRESSION: Moderate motion degraded examination.  Severe ventriculomegaly, advanced from prior imaging concerning for hydrocephalus.  Severe white matter changes may in part reflect transependymal flow cerebral spinal fluid.  Acute sphenoid sinusitis.  Remote large LEFT middle cerebral artery territory infarct.  Electronically Signed   By: Awilda Metro   On: Dec 19, 2013 04:18   Dg Chest Portable 1 View  12/19/2013   CLINICAL DATA:  Facial laceration and respiratory distress. Initial encounter  EXAM: PORTABLE CHEST - 1 VIEW  COMPARISON:  10/03/2013  FINDINGS: Chronic cardiomegaly. Chronic prominence of the hila which is likely vascular. Lung volumes are low. There is no edema, consolidation, effusion, or pneumothorax. Prominent volume of gas in the upper abdomen.  IMPRESSION: 1. Low lung volumes.  No edema or consolidation. 2. Gaseous distension of small and large bowel.   Electronically Signed   By: Tiburcio Pea M.D.   On: 12/19/2013 03:21     EKG Interpretation   Date/Time:  Monday 2013/12/19 02:00:19 EDT Ventricular Rate:  88 PR Interval:  232 QRS Duration: 104 QT Interval:  405 QTC Calculation: 490 R Axis:   167 Text Interpretation:  Sinus rhythm Prolonged PR interval Probable left  atrial enlargement Inferior infarct, acute (RCA) Anterior infarct, old  Lateral leads are also involved Probable RV involvement, suggest recording  right precordial leads ST Elevation in inferior leads new Confirmed by  HORTON  MD, Toni Amend ([id number]) on 2013-12-19 7:17:53 AM      MDM   Final diagnoses:  Sepsis, due to unspecified organism  Diabetic ketoacidosis without coma associated with other specified  diabetes mellitus  Hypernatremia    Patient presents with increasing lethargy and tongue trauma. She is ill appearing on exam. Initial vital signs notable for a rectal temp of 102.9. Initial blood pressure 101/48. Last echo  with a normal ejection fraction. Sepsis protocol initiated. Initial EKG with concerns for ST elevation in the inferior leads. Patient is unable to tell me whether she is having chest pain. Son reports that the patient is DO NOT RESUSCITATE, DO NOT INTUBATE and he would not want her to go to the Cath Lab if this were heart attack. Patient was given Vancomycin and Zosyn. 2 L of fluid was initiated. Patient responded well to fluid resuscitation. Suspect urinary source.  Lab portable for white count of 22.  Comprehensive metabolic panel with sodium of 433, glucose of 1189, creatinine of 1.42, BUN of 98. Patient clinically appears very dry.  Liters 3 and 4 were ordered.  Patient was initiated on glucose stabilizer. Patient in DKA versus non-ketotic hyperglycemic state secondary to sepsis. Given her goals of care and that she is DO NOT RESUSCITATE/DO NOT INTUBATE, will consult hospitalist to admit to the step down bed.    Shon Baton, MD 12/19/2013 2951  Shon Baton, MD December 19, 2013 [phone]  I was called to the patient's bedside at approximately 6:55 AM. Patient was noted to be apneic and without a pulse.  There was some electrical activity noted on the monitor. Bedside ultrasound was obtained showing no evidence of cardiac function. Time of death 24.  Son, French Ana, was updated at [phone].  Shon Baton, MD Dec 19, 2013 [phone]

## 2013-12-12 NOTE — ED Notes (Signed)
Pronounced by dr Wilkie Ayehorton.  Exp;ired

## 2013-12-12 NOTE — ED Notes (Signed)
IVS AND FOLEY REMOVED.

## 2013-12-12 NOTE — ED Notes (Signed)
Post mortem  List sent to bed control

## 2013-12-12 NOTE — ED Notes (Signed)
[facility]

## 2013-12-12 NOTE — ED Notes (Addendum)
CBG Taken  =  > 600 Alerted Nurse.

## 2013-12-12 NOTE — H&P (Addendum)
[facility] History and Physical  [name] HYQ:[id number] DOB: Sep 20, 1924 DOA: 12-04-13  Referring physician: ED physician PCP: Darlina Guys, MD  Specialists:   Chief Complaint: AMS  HPI: Victoria West is a 80 y.o. female past medical history of stroke, diabetes type 2, hypertension, diastolic CHF, hypothyroidism, hyperlipidemia, who presents with altered mental status.   Patient has altered mental status. It is impossible for me to get an accurate medical history from patient. Family member can not be reached. The history is from Dr. Winnifred Friar and from EMS. Dr. Wilkie Aye directly talked to patient's son in ED. Patient is a resident in [facility]. She has a PEG tube. At baseline she is lethargic and does not talk,  but will sometimes squeeze her son's hand. Today, nursing staff noticed that patient had rhythmic movements of her tongue and a trauma in her mouth. She is "a lot more sleepy and lethargic than normal."  In ED, she was found to have CBG>1000. AG=21. She has very little urine with pus-like appearance. WBC=22.0.   CT-head showed: Moderate motion degraded examination. Severe ventriculomegaly, advanced from prior imaging concerning for hydrocephalus. Severe white matter changes may in part reflect transependymal flow cerebral spinal fluid. Acute sphenoid sinusitis. Remote large LEFT middle cerebral artery territory infarct. UA is positive for UTI.  Patient is admitted to inpatient for further evaluation and treatment. She is DO NOT RESUSCITATE/DO NOT INTUBATE.  Patient is noncontributory to history taking.  Review of Systems: As presented in the history of presenting illness, rest negative.  Where does patient live?  [facility] Can patient participate in ADLs? none  Allergy: No Known Allergies  Past Medical History  Diagnosis Date  . Stroke 2015  . Diabetes mellitus without complication   . Hypertension   . Anemia   . Stroke     Past Surgical History  Procedure  Laterality Date  . Radiology with anesthesia N/A 09/25/2013    Procedure: RADIOLOGY WITH ANESTHESIA;  Surgeon: Oneal Grout, MD;  Location: [facility] OR;  Service: Radiology;  Laterality: N/A;  . Esophagogastroduodenoscopy N/A 10/06/2013    Procedure: ESOPHAGOGASTRODUODENOSCOPY (EGD);  Surgeon: Cherylynn Ridges, MD;  Location: [facility] ENDOSCOPY;  Service: General;  Laterality: N/A;  . Peg placement N/A 10/06/2013    Procedure: PERCUTANEOUS ENDOSCOPIC GASTROSTOMY (PEG) PLACEMENT;  Surgeon: Cherylynn Ridges, MD;  Location: [facility] ENDOSCOPY;  Service: General;  Laterality: N/A;  . Esophagogastroduodenoscopy N/A 10/07/2013    Procedure: ESOPHAGOGASTRODUODENOSCOPY (EGD);  Surgeon: Cherylynn Ridges, MD;  Location: [facility] ENDOSCOPY;  Service: General;  Laterality: N/A;  . Peg placement N/A 10/07/2013    Procedure: PERCUTANEOUS ENDOSCOPIC GASTROSTOMY (PEG) PLACEMENT;  Surgeon: Cherylynn Ridges, MD;  Location: [facility] ENDOSCOPY;  Service: General;  Laterality: N/A;    Social History:  reports that she has never smoked. She has never used smokeless tobacco. She reports that she does not drink alcohol or use illicit drugs.  Family History: unable to obtain due to AMS  Prior to Admission medications   Medication Sig Start Date End Date Taking? Authorizing Provider  acetaminophen (TYLENOL) 500 MG tablet 1,000 mg by PEG Tube route 3 (three) times daily.   Yes Historical Provider, MD  amLODipine (NORVASC) 10 MG tablet Place 1 tablet (10 mg total) into feeding tube daily. 10/11/13  Yes Layne Benton, NP  aspirin 81 MG chewable tablet Place 1 tablet (81 mg total) into feeding tube daily. 10/11/13  Yes Layne Benton, NP  carvedilol (COREG) 6.25 MG tablet Take  6.25 mg by mouth 2 (two) times daily with a meal.   Yes Historical Provider, MD  cloNIDine (CATAPRES) 0.1 MG tablet 0.1 mg by PEG Tube route 2 (two) times daily.   Yes Historical Provider, MD  dorzolamide-timolol (COSOPT) 22.3-6.8 MG/ML ophthalmic solution Place 1 drop into both eyes 2 (two)  times daily.   Yes Historical Provider, MD  hydrALAZINE (APRESOLINE) 100 MG tablet 100 mg by PEG Tube route 3 (three) times daily.    Yes Historical Provider, MD  hydrALAZINE (APRESOLINE) 25 MG tablet 25 mg by PEG Tube route 3 (three) times daily. Give with 100mg  tablet to = 125mg /dose   Yes Historical Provider, MD  iron polysaccharides (NIFEREX) 150 MG capsule Place 1 capsule (150 mg total) into feeding tube daily. 10/11/13  Yes Layne Benton, NP  lisinopril (PRINIVIL,ZESTRIL) 20 MG tablet 20 mg by PEG Tube route 2 (two) times daily.    Yes Historical Provider, MD  LORazepam (ATIVAN) 0.5 MG tablet 0.5 mg by PEG Tube route 3 (three) times daily.   Yes Historical Provider, MD  methimazole (TAPAZOLE) 5 MG tablet 5 mg by PEG Tube route See admin instructions. Give 1 tablet daily via tube on Monday, Wednesday and Friday. Give 1 tablet twice daily on Tuesday, Thursday, Saturday and Sunday.   Yes Historical Provider, MD  Multiple Vitamins-Minerals (DECUBI-VITE PO) 1 tablet by PEG Tube route daily.   Yes Historical Provider, MD  nitroGLYCERIN (NITRODUR - DOSED IN MG/24 HR) 0.4 mg/hr patch Place 0.4 mg onto the skin daily.   Yes Historical Provider, MD  Nutritional Supplements (FEEDING SUPPLEMENT, OSMOLITE 1.2 CAL,) LIQD Place 1,000 mLs into feeding tube continuous. 10/11/13  Yes Layne Benton, NP  pravastatin (PRAVACHOL) 10 MG tablet 10 mg by PEG Tube route at bedtime.    Yes Historical Provider, MD  sertraline (ZOLOFT) 100 MG tablet Take 100 mg by mouth daily. Take with 25mg  to equal total 125mg    Yes Historical Provider, MD  sertraline (ZOLOFT) 25 MG tablet Take 25 mg by mouth daily. Take with 100mg  to equal total 125mg    Yes Historical Provider, MD  Skin Protectants, Misc. (EUCERIN) cream Apply 1 application topically 2 (two) times daily. Left leg and foot   Yes Historical Provider, MD  Travoprost, BAK Free, (TRAVATAN) 0.004 % SOLN ophthalmic solution Place 1 drop into the right eye at bedtime.   Yes  Historical Provider, MD  UNABLE TO FIND 2 scoop by PEG Tube route daily. Med Name: Procell powder-add to 4-6 ounces of water   Yes Historical Provider, MD    Physical Exam: Filed Vitals:   12-01-2013 0545 2013/12/01 0611 01-Dec-2013 0617 12-01-13 0646  BP: 107/83 98/48 115/51 86/42  Pulse: 68 73 71 72  Temp:      TempSrc:      Resp:  24 26 24   Height:      Weight:      SpO2: 99% 100% 100% 99%   General: Lethargic, ill appearing, elderly  HEENT:Mucous membranes dry, multiple tongue lacerations noted with dry blood about the oropharynx      Cardiac: S1/S2, RRR, No murmurs, gallops or rubs Pulm: Coarse breath sounds bilaterally, diffused rhonchi, No rubs. Abd: Soft, very distended, not sure whether tender or not,  no organomegaly, BS present. PEG tube in place without surrounding erythema  Ext: No edema. 2+DP/PT pulse bilaterally. R BKA Musculoskeletal: No joint deformities, erythema, or stiffness, ROM full Skin: Sacral decubitus ulcer  Neuro: AMS, not oriented X3, cranial nerves II-XII  grossly intact, moves all extremities. Genitourinary: Foley catheter in place with purulent urine   Labs on Admission:  Basic Metabolic Panel:  Recent Labs Lab 10-04-2013 0234  NA 157*  K 4.7  CL 117*  CO2 19  GLUCOSE 1189*  BUN 98*  CREATININE 1.42*  CALCIUM 8.9   Liver Function Tests:  Recent Labs Lab 10-04-2013 0234  AST 38*  ALT 55*  ALKPHOS 118*  BILITOT <0.2*  PROT 6.4  ALBUMIN 2.3*   No results found for this basename: LIPASE, AMYLASE,  in the last 168 hours No results found for this basename: AMMONIA,  in the last 168 hours CBC:  Recent Labs Lab 10-04-2013 0234  WBC 22.0*  NEUTROABS 20.7*  HGB 12.9  HCT 44.6  MCV 116.4*  PLT 333   Cardiac Enzymes:  Recent Labs Lab 10-04-2013 0234  TROPONINI <0.30    BNP (last 3 results) No results found for this basename: PROBNP,  in the last 8760 hours CBG:  Recent Labs Lab 10-04-2013 0651  GLUCAP >600*    Radiological Exams  on Admission: Ct Head Wo Contrast  03-12-2013   CLINICAL DATA:  Acute injury, tongue trauma at 11 p.m. No known seizure. Lethargy. Abnormal mouth movements.  EXAM: CT HEAD WITHOUT CONTRAST  TECHNIQUE: Contiguous axial images were obtained from the base of the skull through the vertex without intravenous contrast.  COMPARISON:  CT of the head October 02, 2013  FINDINGS: Moderately motion degraded examination, study was re-attempted with no significant image quality improvement.  No intraparenchymal hemorrhage, mass effect or midline shift. Severe ventriculomegaly, increased from prior examination. Anterior recess of the third ventricle is 16 mm, previously 11 mm. No intraparenchymal hemorrhage, midline shift. Confluent periventricular and deep white matter hypodensities, increased from prior examination. LEFT frontotemporal parietal encephalomalacia. No acute large vascular territory infarct.  No abnormal extra-axial fluid collections. Severe calcific atherosclerosis of the carotid siphons. No skull fracture. RIGHT sphenoid sinus air-fluid level. Status post bilateral ocular lens implants.  IMPRESSION: Moderate motion degraded examination.  Severe ventriculomegaly, advanced from prior imaging concerning for hydrocephalus.  Severe white matter changes may in part reflect transependymal flow cerebral spinal fluid.  Acute sphenoid sinusitis.  Remote large LEFT middle cerebral artery territory infarct.   Electronically Signed   By: Awilda Metroourtnay  Bloomer   On: 03-12-2013 04:18   Dg Chest Portable 1 View  03-12-2013   CLINICAL DATA:  Facial laceration and respiratory distress. Initial encounter  EXAM: PORTABLE CHEST - 1 VIEW  COMPARISON:  10/03/2013  FINDINGS: Chronic cardiomegaly. Chronic prominence of the hila which is likely vascular. Lung volumes are low. There is no edema, consolidation, effusion, or pneumothorax. Prominent volume of gas in the upper abdomen.  IMPRESSION: 1. Low lung volumes.  No edema or  consolidation. 2. Gaseous distension of small and large bowel.   Electronically Signed   By: Tiburcio PeaJonathan  Watts M.D.   On: 03-12-2013 03:21    EKG: Independently reviewed.   Assessment/Plan Principal Problem:   Severe sepsis Active Problems:   Cerebral embolism with cerebral infarction   Hypertension   Diabetes type 2, controlled   Dementia   HLD (hyperlipidemia)   Diastolic CHF   Hyperthyroidism   Acute encephalopathy   Hyperosmolar non-ketotic state in patient with type 2 diabetes mellitus  1. Severe sepsis secondary to UTI: Patient has positive urinalysis for UTI. Lactic acid of 4.7 -will admit to step down unit - Start broad coverage of antibiotics: Vancomycin and Zosyn since she may have other  source of infection - Aggressive IVF: 4 L of NS bolus, followed by 150cc/h - repeat lactic acid - follow up urine culture result  2. DM-II and HHS: her CBG 1189 and no ketone in urine.  -started DKA protocol - IVF as above  3. acute encephalopathy: Secondary to #1 and #2  3. HTN: now hypotensive -hold all bp meds  4. Acute AKI: cre 1.42. Most likley due to UTI and prerenal since he is severely dehydrated  -IVF -trend Cre by BMP  5. Hyperthyrodism: on methimazole at home - will continue home meds.  - will not check TSH in the setting of acute illness.  6. Diastolic CHF: 2-D echo on 09/27/13 showed EF 75% with grade 1 diastolic dysfunction. Currently patient is clinically dehydrated - IVF as above. - hold bp meds - continue ASA. - trop x 3  7.  Abdominal distention: Etiology is not clear - get abdominal x-ray to r/o free air.  DVT ppx: SQ Heparin  Code Status: Full code Family Communication: None at bed side. Disposition Plan: Admit to inpatient   Date of Service 11/11/13    Lorretta HarpIU, Florentino Laabs [facility] Pager [phone]  If 7PM-7AM, please contact night-coverage [email or web address] Password [email or web address] 11/11/13, 7:01 AM

## 2013-12-12 DEATH — deceased

## 2015-08-12 IMAGING — CR DG ABD PORTABLE 1V
1 series · 1 of 1 positions shown · non-contrast
Comparison: Eighteen 4546

CLINICAL DATA: NG Tube Placement

EXAM:
PORTABLE ABDOMEN - 1 VIEW

[AP]
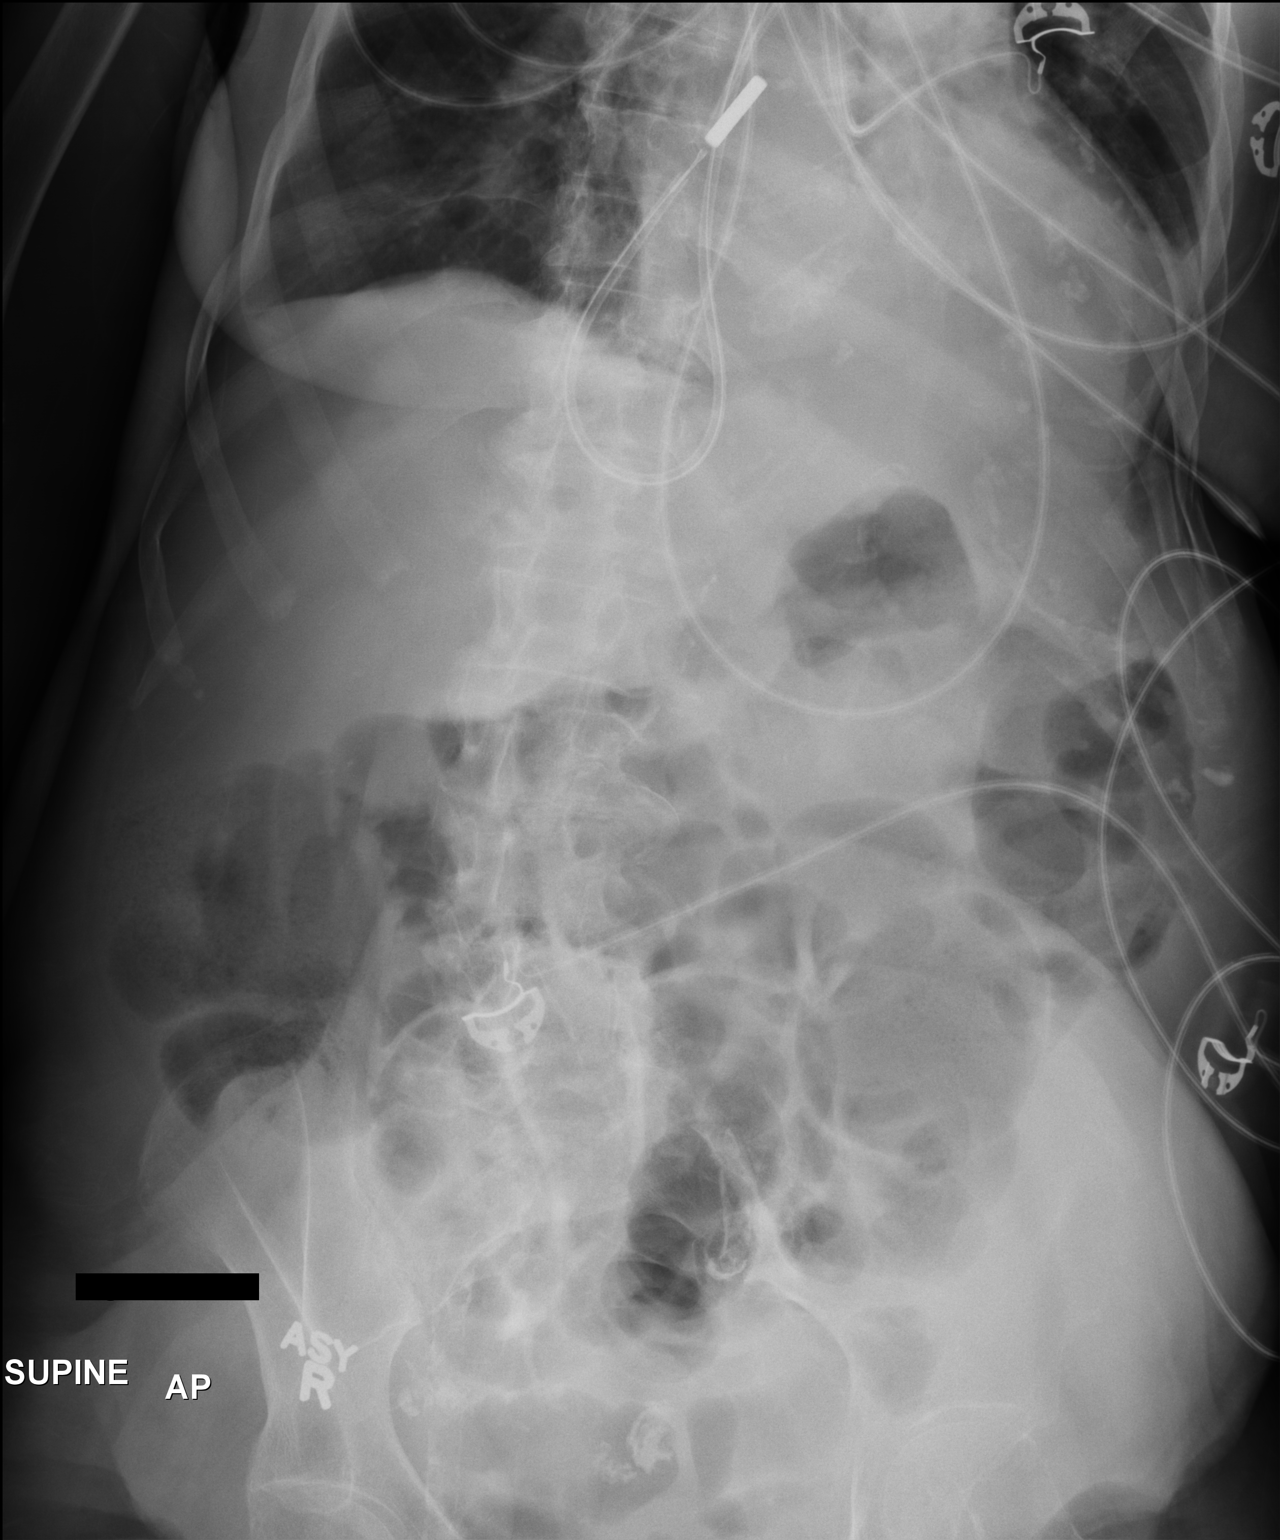

[1 of 1 positions shown; findings below may reference images not displayed]

FINDINGS: Feeding tube loops in distal esophagus. Bowel gas pattern
unremarkable. Degenerative changes in the lumbar spine.
IMPRESSION: 1. Feeding tube looped in the distal esophagus.

## 2015-08-12 IMAGING — CR DG ABD PORTABLE 1V
1 series · 1 of 1 positions shown · non-contrast
Comparison: None.

CLINICAL DATA: Feeding tube placement.

EXAM:
PORTABLE ABDOMEN - 1 VIEW

[AP]
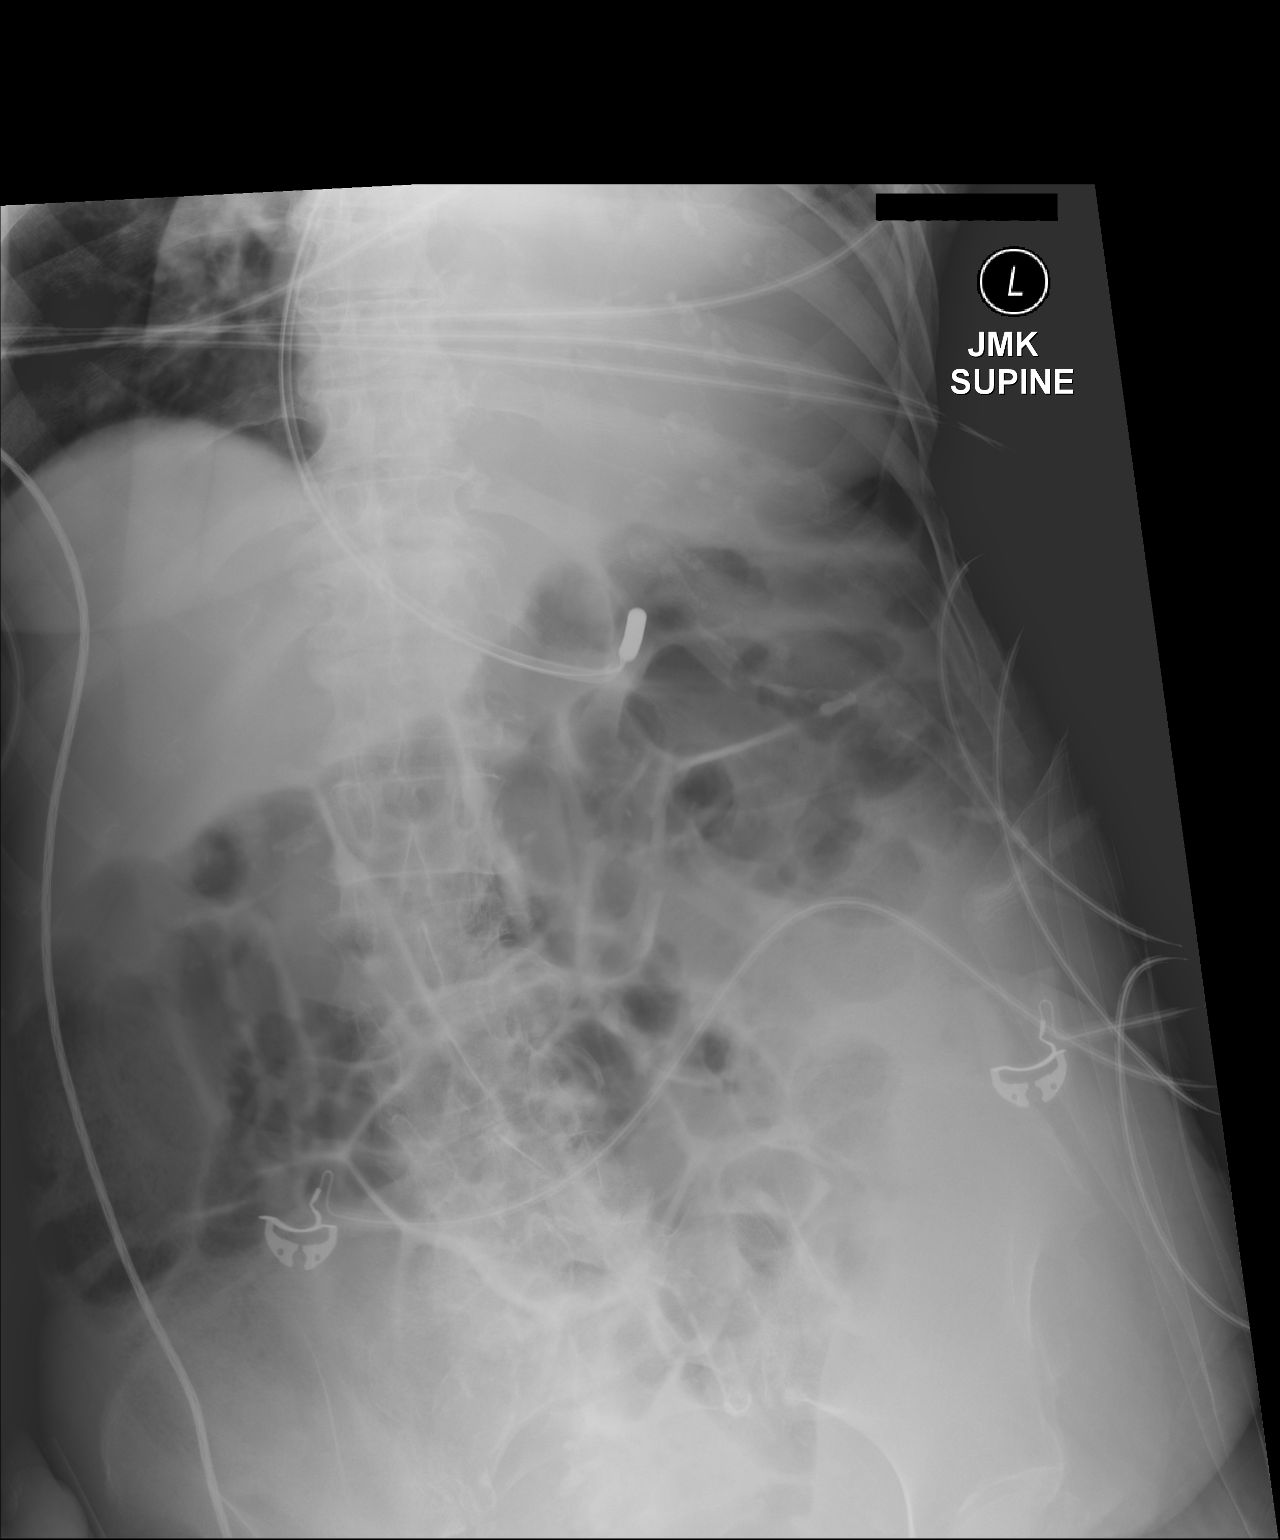

[1 of 1 positions shown; findings below may reference images not displayed]

FINDINGS: Feeding tube is in place with the tip in the stomach.
IMPRESSION: As above.

## 2015-08-14 IMAGING — CR DG ABD PORTABLE 1V
1 series · 1 of 1 positions shown · non-contrast
Comparison: CT abdomen and pelvis October 02, 2013

CLINICAL DATA: Feeding tube placement

EXAM:
PORTABLE ABDOMEN - 1 VIEW

[AP]
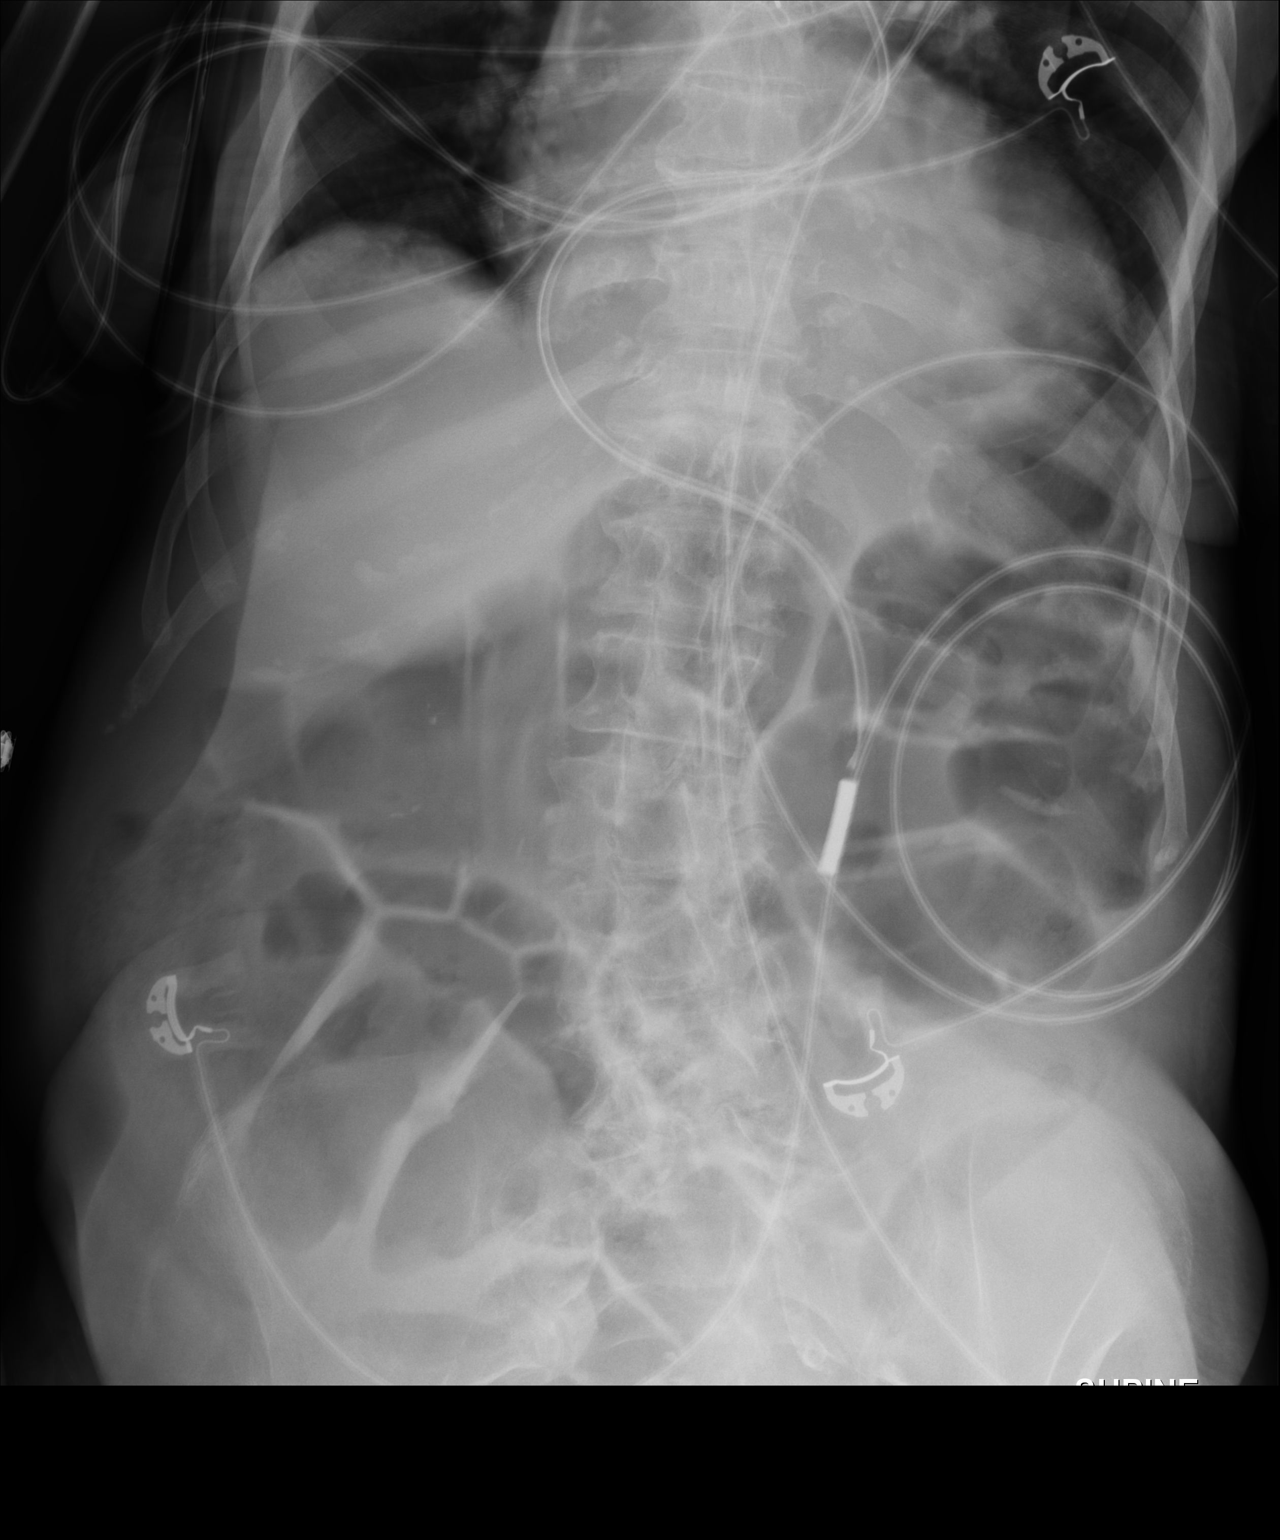

[1 of 1 positions shown; findings below may reference images not displayed]

FINDINGS: Feeding tube tip is in the body of the stomach. There is mild
generalized colonic dilatation suggesting colonic ileus. The soft
tissue mass over the left pelvis is seen on this radiographic
examination is much better delineated on CT. Multiple intrauterine
leiomyomas are noted in the pelvis as well as vascular
calcification.
IMPRESSION: Feeding tube tip in stomach. Suspect a degree of colonic ileus. The
soft tissue mass in the left pelvic region noted on CT is also
apparent on radiographic examination.

## 2015-08-21 IMAGING — CR DG HIP 1V PORT*L*
2 series · 2 of 2 positions shown · non-contrast
Comparison: KUB performed earlier today.

CLINICAL DATA: Pain, deformity.

EXAM:
PORTABLE LEFT HIP - 1 VIEW

[AP (1 of 2)]
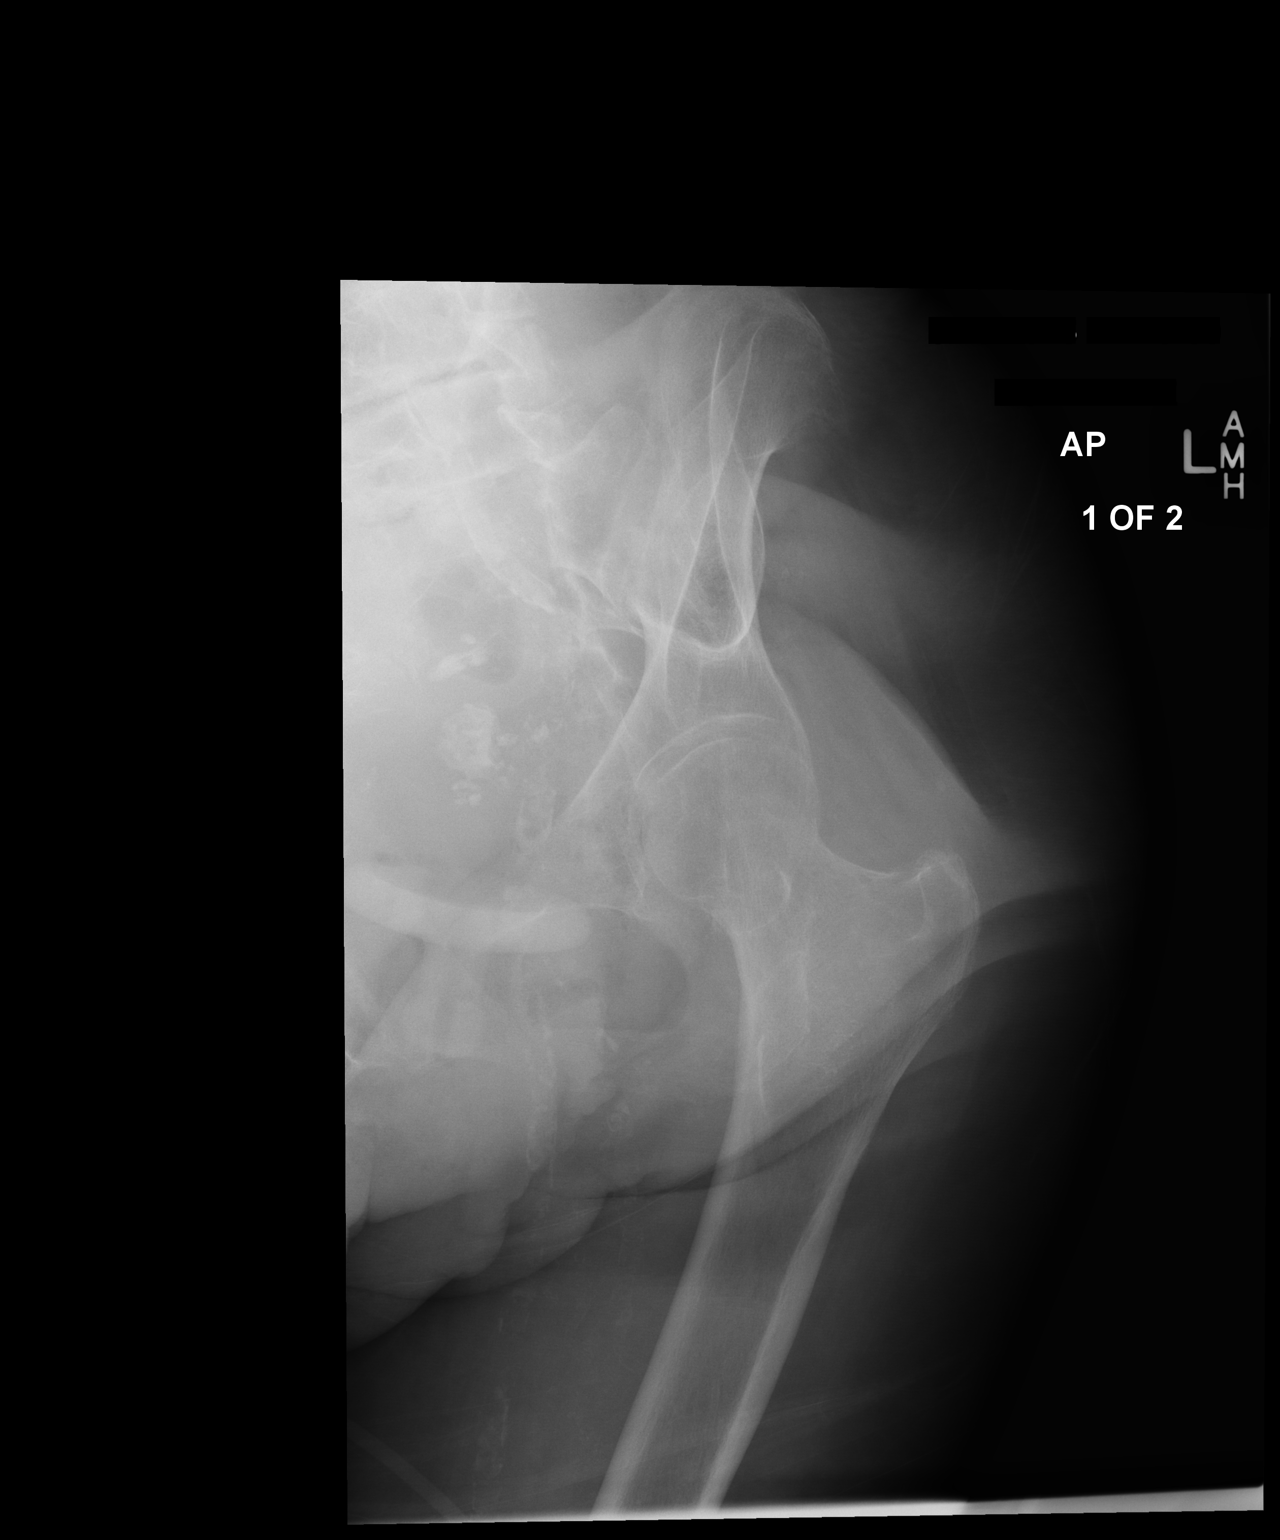

[AP (2 of 2)]
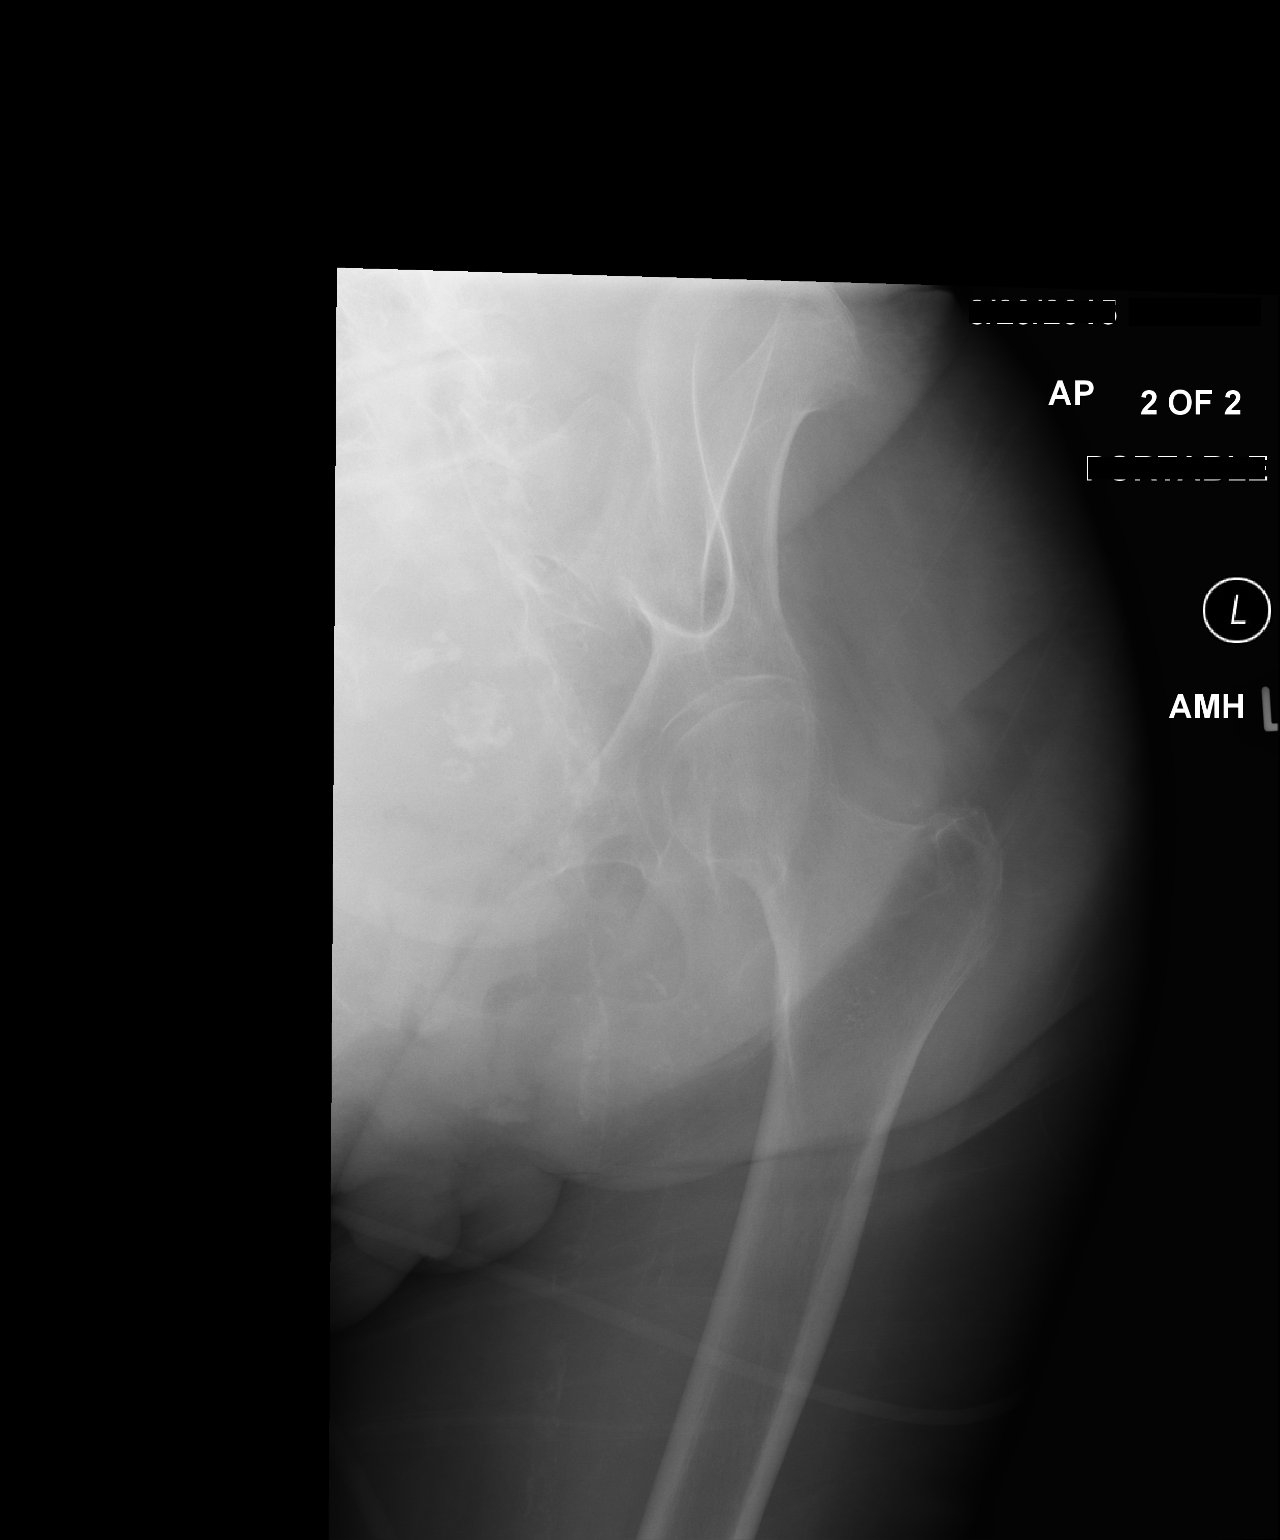

[2 of 2 positions shown; findings below may reference images not displayed]

FINDINGS: Very limited evaluation due to patient condition. I see no femoral
neck fracture. There appears to be diffuse osteopenia. No evidence
of subluxation or dislocation at the left hip. Remainder of the
pelvis is difficult to evaluate due to positioning.
IMPRESSION: Very limited views. I see no evidence of femoral neck fracture.
Consider dedicated pelvic view to better assess.

## 2015-10-12 ENCOUNTER — Telehealth: Payer: Self-pay | Admitting: Family Medicine
